# Patient Record
Sex: Female | Born: 1970 | Race: White | Hispanic: No | Smoking: Never smoker
Health system: Southern US, Community
[De-identification: ages and names within clinical notes are randomized; demographics above are authoritative.]

## PROBLEM LIST (undated history)

## (undated) DIAGNOSIS — Z800 Family history of malignant neoplasm of digestive organs: Secondary | ICD-10-CM

## (undated) DIAGNOSIS — N390 Urinary tract infection, site not specified: Secondary | ICD-10-CM

## (undated) DIAGNOSIS — Z8619 Personal history of other infectious and parasitic diseases: Secondary | ICD-10-CM

## (undated) DIAGNOSIS — Z8052 Family history of malignant neoplasm of bladder: Secondary | ICD-10-CM

## (undated) DIAGNOSIS — T7840XA Allergy, unspecified, initial encounter: Secondary | ICD-10-CM

## (undated) HISTORY — PX: BREAST CYST ASPIRATION: SHX578

## (undated) HISTORY — DX: Personal history of other infectious and parasitic diseases: Z86.19

## (undated) HISTORY — DX: Urinary tract infection, site not specified: N39.0

## (undated) HISTORY — DX: Family history of malignant neoplasm of bladder: Z80.52

## (undated) HISTORY — DX: Family history of malignant neoplasm of digestive organs: Z80.0

## (undated) HISTORY — DX: Allergy, unspecified, initial encounter: T78.40XA

---

## 1998-03-02 ENCOUNTER — Other Ambulatory Visit: Admission: RE | Admit: 1998-03-02 | Discharge: 1998-03-02 | Payer: Self-pay | Admitting: Obstetrics and Gynecology

## 1998-10-22 ENCOUNTER — Ambulatory Visit (HOSPITAL_COMMUNITY): Admission: RE | Admit: 1998-10-22 | Discharge: 1998-10-22 | Payer: Self-pay | Admitting: Obstetrics and Gynecology

## 1999-06-24 ENCOUNTER — Other Ambulatory Visit: Admission: RE | Admit: 1999-06-24 | Discharge: 1999-06-24 | Payer: Self-pay | Admitting: Obstetrics and Gynecology

## 2000-02-08 HISTORY — PX: DILATION AND CURETTAGE OF UTERUS: SHX78

## 2000-07-17 ENCOUNTER — Other Ambulatory Visit: Admission: RE | Admit: 2000-07-17 | Discharge: 2000-07-17 | Payer: Self-pay | Admitting: Obstetrics and Gynecology

## 2001-02-15 ENCOUNTER — Ambulatory Visit (HOSPITAL_COMMUNITY): Admission: RE | Admit: 2001-02-15 | Discharge: 2001-02-15 | Payer: Self-pay | Admitting: Obstetrics and Gynecology

## 2001-02-15 ENCOUNTER — Encounter (INDEPENDENT_AMBULATORY_CARE_PROVIDER_SITE_OTHER): Payer: Self-pay

## 2001-07-04 ENCOUNTER — Encounter: Payer: Self-pay | Admitting: Obstetrics and Gynecology

## 2001-07-04 ENCOUNTER — Inpatient Hospital Stay (HOSPITAL_COMMUNITY): Admission: AD | Admit: 2001-07-04 | Discharge: 2001-07-04 | Payer: Self-pay | Admitting: Obstetrics and Gynecology

## 2001-07-18 ENCOUNTER — Ambulatory Visit (HOSPITAL_COMMUNITY): Admission: RE | Admit: 2001-07-18 | Discharge: 2001-07-18 | Payer: Self-pay | Admitting: Obstetrics and Gynecology

## 2001-08-13 ENCOUNTER — Other Ambulatory Visit: Admission: RE | Admit: 2001-08-13 | Discharge: 2001-08-13 | Payer: Self-pay | Admitting: Obstetrics & Gynecology

## 2001-08-14 ENCOUNTER — Ambulatory Visit (HOSPITAL_COMMUNITY): Admission: RE | Admit: 2001-08-14 | Discharge: 2001-08-14 | Payer: Self-pay | Admitting: Obstetrics & Gynecology

## 2001-08-14 ENCOUNTER — Encounter (INDEPENDENT_AMBULATORY_CARE_PROVIDER_SITE_OTHER): Payer: Self-pay

## 2001-12-20 ENCOUNTER — Encounter (INDEPENDENT_AMBULATORY_CARE_PROVIDER_SITE_OTHER): Payer: Self-pay

## 2001-12-20 ENCOUNTER — Observation Stay (HOSPITAL_COMMUNITY): Admission: RE | Admit: 2001-12-20 | Discharge: 2001-12-21 | Payer: Self-pay | Admitting: Obstetrics & Gynecology

## 2001-12-20 ENCOUNTER — Encounter: Payer: Self-pay | Admitting: Obstetrics & Gynecology

## 2002-10-16 ENCOUNTER — Other Ambulatory Visit: Admission: RE | Admit: 2002-10-16 | Discharge: 2002-10-16 | Payer: Self-pay | Admitting: Obstetrics & Gynecology

## 2003-10-27 ENCOUNTER — Other Ambulatory Visit: Admission: RE | Admit: 2003-10-27 | Discharge: 2003-10-27 | Payer: Self-pay | Admitting: Obstetrics & Gynecology

## 2005-01-26 ENCOUNTER — Other Ambulatory Visit: Admission: RE | Admit: 2005-01-26 | Discharge: 2005-01-26 | Payer: Self-pay | Admitting: Obstetrics & Gynecology

## 2005-02-25 ENCOUNTER — Ambulatory Visit: Payer: Self-pay | Admitting: Internal Medicine

## 2005-06-22 ENCOUNTER — Ambulatory Visit: Payer: Self-pay | Admitting: Internal Medicine

## 2006-01-17 ENCOUNTER — Ambulatory Visit: Payer: Self-pay | Admitting: Internal Medicine

## 2006-05-25 ENCOUNTER — Ambulatory Visit: Payer: Self-pay | Admitting: Internal Medicine

## 2006-11-01 DIAGNOSIS — B019 Varicella without complication: Secondary | ICD-10-CM | POA: Insufficient documentation

## 2008-07-22 ENCOUNTER — Ambulatory Visit: Payer: Self-pay | Admitting: Internal Medicine

## 2008-07-22 DIAGNOSIS — K921 Melena: Secondary | ICD-10-CM | POA: Insufficient documentation

## 2008-07-22 DIAGNOSIS — M542 Cervicalgia: Secondary | ICD-10-CM | POA: Insufficient documentation

## 2008-08-08 ENCOUNTER — Encounter: Payer: Self-pay | Admitting: Internal Medicine

## 2008-08-08 HISTORY — PX: COLONOSCOPY: SHX174

## 2008-08-18 ENCOUNTER — Telehealth: Payer: Self-pay | Admitting: Internal Medicine

## 2008-08-22 ENCOUNTER — Ambulatory Visit: Payer: Self-pay | Admitting: Internal Medicine

## 2008-08-22 LAB — CONVERTED CEMR LAB
ALT: 16 units/L (ref 0–35)
AST: 20 units/L (ref 0–37)
Albumin: 4.4 g/dL (ref 3.5–5.2)
Alkaline Phosphatase: 38 units/L — ABNORMAL LOW (ref 39–117)
BUN: 15 mg/dL (ref 6–23)
Basophils Absolute: 0 10*3/uL (ref 0.0–0.1)
Basophils Relative: 0.7 % (ref 0.0–3.0)
Bilirubin Urine: NEGATIVE
Bilirubin, Direct: 0.2 mg/dL (ref 0.0–0.3)
CO2: 28 meq/L (ref 19–32)
Calcium: 9.4 mg/dL (ref 8.4–10.5)
Chloride: 110 meq/L (ref 96–112)
Cholesterol: 152 mg/dL (ref 0–200)
Creatinine, Ser: 0.9 mg/dL (ref 0.4–1.2)
Eosinophils Absolute: 0.2 10*3/uL (ref 0.0–0.7)
Eosinophils Relative: 3.7 % (ref 0.0–5.0)
GFR calc non Af Amer: 73.67 mL/min (ref >60)
Glucose, Bld: 79 mg/dL (ref 70–99)
HCT: 40 % (ref 36.0–46.0)
HDL: 47.5 mg/dL (ref >39.00)
Hemoglobin, Urine: NEGATIVE
Hemoglobin: 13.4 g/dL (ref 12.0–15.0)
Ketones, ur: NEGATIVE mg/dL
LDL Cholesterol: 91 mg/dL (ref 0–99)
Leukocytes, UA: NEGATIVE
Lymphocytes Relative: 26.4 % (ref 12.0–46.0)
Lymphs Abs: 1.2 10*3/uL (ref 0.7–4.0)
MCHC: 33.6 g/dL (ref 30.0–36.0)
MCV: 86.8 fL (ref 78.0–100.0)
Monocytes Absolute: 0.6 10*3/uL (ref 0.1–1.0)
Monocytes Relative: 12.5 % — ABNORMAL HIGH (ref 3.0–12.0)
Neutro Abs: 2.7 10*3/uL (ref 1.4–7.7)
Neutrophils Relative %: 56.7 % (ref 43.0–77.0)
Nitrite: NEGATIVE
Platelets: 203 10*3/uL (ref 150.0–400.0)
Potassium: 4.2 meq/L (ref 3.5–5.1)
RBC: 4.6 M/uL (ref 3.87–5.11)
RDW: 13 % (ref 11.5–14.6)
Sodium: 143 meq/L (ref 135–145)
Specific Gravity, Urine: >=1.03 (ref 1.000–1.030)
TSH: 4.05 microintl units/mL (ref 0.35–5.50)
Total Bilirubin: 1.2 mg/dL (ref 0.3–1.2)
Total CHOL/HDL Ratio: 3
Total Protein: 7.1 g/dL (ref 6.0–8.3)
Triglycerides: 66 mg/dL (ref 0.0–149.0)
Urine Glucose: NEGATIVE mg/dL
Urobilinogen, UA: 0.2 (ref 0.0–1.0)
VLDL: 13.2 mg/dL (ref 0.0–40.0)
WBC: 4.7 10*3/uL (ref 4.5–10.5)
pH: 5 (ref 5.0–8.0)

## 2008-08-27 ENCOUNTER — Ambulatory Visit: Payer: Self-pay | Admitting: Internal Medicine

## 2009-02-05 ENCOUNTER — Ambulatory Visit: Payer: Self-pay | Admitting: Internal Medicine

## 2009-02-05 DIAGNOSIS — M25559 Pain in unspecified hip: Secondary | ICD-10-CM | POA: Insufficient documentation

## 2009-08-06 ENCOUNTER — Encounter: Payer: Self-pay | Admitting: Internal Medicine

## 2009-08-18 ENCOUNTER — Encounter: Payer: Self-pay | Admitting: Internal Medicine

## 2009-11-12 ENCOUNTER — Encounter: Payer: Self-pay | Admitting: Internal Medicine

## 2010-03-09 NOTE — Letter (Signed)
Summary: Dermatology / [facility]  Dermatology / [facility]   Imported By: Lennie Odor 09/15/2009 10:56:53  _____________________________________________________________________  External Attachment:    Type:   Image     Comment:   External Document

## 2010-03-09 NOTE — Letter (Signed)
Summary: [facility]   Imported By: Sherian Rein 11/26/2009 11:20:14  _____________________________________________________________________  External Attachment:    Type:   Image     Comment:   External Document

## 2010-06-25 NOTE — Op Note (Signed)
[facility]  Patient:    Tracey Leblanc, Tracey Leblanc Visit Number: [id number] MRN: [id number]          Service Type: DSU Location: [facility] Attending Physician:  Shaune Spittle Dictated by:   Maris Berger. Pennie Rushing, M.D. Proc. Date: 02/15/01 Admit Date:  02/15/2001                             Operative Report  PREOPERATIVE DIAGNOSIS:       Missed abortion at eight weeks.  POSTOPERATIVE DIAGNOSIS:      Missed abortion at eight weeks.  OPERATION:                    Suction dilatation and evacuation.  SURGEON:                      Vanessa P. Pennie Rushing, M.D.  ANESTHESIA:                   Monitored anesthesia care and local.  ESTIMATED BLOOD LOSS:         Less than 50 cc.  COMPLICATIONS:                None.  FINDINGS:                     The uterus was approximately 8-10 week size with a moderate amount of products of conception.  DESCRIPTION OF PROCEDURE:     The patient was taken to the operating room after appropriate identification and placed on the operating table.  After placement of equipment for monitored anesthesia care, she was placed in the lithotomy position.  The perineum and vagina were prepped with multiple layers of Betadine and draped as a sterile field.  A red rubber Robinson catheter was used to empty the bladder.  A Graves speculum was placed in the vagina and a single-tooth tenaculum placed on the anterior cervix.  A paracervical block was achieved with a total of 10 cc of 2% Xylocaine at the 5 and 7 oclock positions.  The cervix was then dilated to accommodate a #8 suction curet. This was used to suction evacuate all quadrants of the uterus.  The uterine cavity was then curetted to ensure that all products of conception had been removed.  All instruments were then removed from the vagina and the patient was taken from the operating room to the recovery room in satisfactory condition, having tolerated the procedure well, with  sponge and instrument counts correct.  SPECIMENS TO PATHOLOGY:       Products of conception.  BLOOD TYPE:                   O negative.  DISCHARGE INSTRUCTIONS:       The patient is to receive RhoGAM prior to discharge.  DISCHARGE MEDICATIONS:        Methergine 0.2 mg p.o. q.6h. for eight doses. Ibuprofen over-the-counter 600 mg p.o. q.6h. p.r.n. pain.  Doxycycline 100 gm p.o. b.i.d. for five days.  FOLLOW UP:                    The patient is to follow up in two weeks.  INSTRUCTIONS:                 Printed instructions from the [facility] for D&E. Dictated by:   Maris Berger. Haygood,  M.D. Attending Physician:  Shaune Spittle DD:  02/15/01 TD:  02/15/01 Job: [id number]

## 2010-06-25 NOTE — Op Note (Signed)
   NAME:  Tracey Leblanc, Tracey Leblanc                      ACCOUNT NO.:  [id number]   MEDICAL RECORD NO.:  [id number]                   PATIENT TYPE:  AMB   LOCATION:  SDC                                  FACILITY:  [facility]   PHYSICIAN:  Freddy Finner, M.D.                DATE OF BIRTH:  Apr 11, 1970   DATE OF PROCEDURE:  12/20/2001  DATE OF DISCHARGE:                                 OPERATIVE REPORT   ADDENDUM:   PREOPERATIVE DIAGNOSIS:  Missed abortion with ultrasound evidence of failed  evacuation of the uterine cavity, suspected cornual pregnancy versus uterine  perforation with failure to evacuate the cavity.   POSTOPERATIVE DIAGNOSES:  1. Incomplete perforation of uterine wall.  2. Acutely anteflexed fundus with retained missed abortion products of     conception.   OPERATIVE PROCEDURE:  Dilatation and evacuation under ultrasound guidance  with evacuation of uterine cavity and identification by ultrasound of  incomplete perforation of uterus posterior to gestational sac.   SURGEON:  Freddy Finner, M.D.   ANESTHESIA:  General.   INTRAOPERATIVE COMPLICATIONS:  None.   ESTIMATED INTRAOPERATIVE BLOOD LOSS:  Less than or equal to 50 cc.   DESCRIPTION OF PROCEDURE:  The previous operative note described the  procedure to date.  On return to the operating room, blunt-tipped polyp  forceps were placed into the tract generated by the previous dilatation.  This appeared to pass to the right and inferior to the gestational sac.  Using a Pratt dilator and ultrasound guidance, the internal os of the cervix  was navigated under ultrasound.  This allowed placement of an 8 mm curved  suction cannula and ultrasound real time observation of evacuation of  cavity.  The Pratt dilator #25 was again passed along the original tract.  It could be traced to the uterine wall posteriorly, but no through and  through perforation identifiable by ultrasound on the serosal surface of the  uterus.  There was  small accumulation of presumed blood or small hematoma  posterior to the uterus.  Based on the complications of this procedure, it  was elected to admit the patient for overnight observation.  The procedure  at this point was terminated.  She was taken to the recovery room and will  be admitted as noted above.                                                Freddy Finner, M.D.    WRN/MEDQ  D:  12/20/2001  T:  12/20/2001  Job:  [id number]

## 2010-06-25 NOTE — Op Note (Signed)
NAME:  Tracey Leblanc, Tracey Leblanc                      ACCOUNT NO.:  [id number]   MEDICAL RECORD NO.:  [id number]                   PATIENT TYPE:  AMB   LOCATION:  SDC                                  FACILITY:  [facility]   PHYSICIAN:  Freddy Finner, M.D.                DATE OF BIRTH:  1970/11/05   DATE OF PROCEDURE:  12/20/2001  DATE OF DISCHARGE:                                 OPERATIVE REPORT   PREOPERATIVE DIAGNOSIS:  Missed abortion.   POSTOPERATIVE DIAGNOSIS:  Missed abortion with concern over failure to  adequate remove the pregnancy.   ANESTHESIA:  Managed intravenous sedation and paracervical block.   INTRAOPERATIVE COMPLICATIONS:  None immediately apparent at the conclusion  of the procedure.   INDICATIONS:  The patient is a 40 year old with her third successive  nonviable pregnancy.  She was found to have a nonviable pregnancy by pelvic  ultrasound in the office on the day prior to surgery with a 7-week size  fetus, which was nonviable with no fetal heart activity.   DESCRIPTION OF PROCEDURE:  The patient was admitted, brought to the  operating room, and there placed under intravenous sedation.  A paracervical  block was placed using 10 cc of 1% plain Xylocaine.  The cervix was grasped  with a single-tooth tenaculum and progressively dilated to 23 and later to  25 with [facility] dilators.  The cavity was felt to be entered and was  thoroughly sampled with a Haney curet in anticipation of obtaining material  for chromosomal analysis.  The significant finding was the absence of  definitive parts products of conception.  A careful bimanual examination was  performed and the uterus was asymmetrically enlarged to the right with a 3-4  cm, soft, nodular area on the fundus.  The curet was placed into the uterine  cavity and could be palpated through the anterior abdominal wall and was  felt to still be within the endometrial cavity.  At this point, the  procedure was terminated and the  patient was taken to recovery with a plan  for bedside ultrasound, which was accomplished and showing that the  pregnancy had not been removed.  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  There was a suggestion of a 3-4 cm hematoma in the pelvis in  the cul-de-sac.  All of these findings raised significant concern.  The plan  is at this point to take the patient back to the operating room for  ultrasound intraoperatively and if adequate access to the procedure cannot  be accomplished in this way, laparoscopy and possible laparotomy.  The  differential diagnosis certainly includes the possibility of a cornual or  saccular pregnancy.  The estimated intraoperative blood loss during this  part of the procedure was less than 25 cc.  This concludes the dictation of  this portion of the operative note.  An addendum will be made after  completion of the additional procedure.                                               Freddy Finner, M.D.    WRN/MEDQ  D:  12/20/2001  T:  12/20/2001  Job:  [id number]

## 2010-06-25 NOTE — Op Note (Signed)
[facility]  Patient:    Tracey Leblanc, Tracey Leblanc Visit Number: [id number] MRN: [id number]          Service Type: DSU Location: [facility] Attending Physician:  Minette Headland Dictated by:   Freddy Finner, M.D. Proc. Date: 08/14/01 Admit Date:  08/14/2001                             Operative Report  PREOPERATIVE DIAGNOSIS:  Missed abortion.  POSTOPERATIVE DIAGNOSIS:  Missed abortion.  OPERATION:  Dilatation and evacuation.  SURGEON:  Freddy Finner, M.D.  ANESTHESIA: Intravenous sedation and paracervical block.  INTRAOPERATIVE COMPLICATIONS:  None.  ESTIMATED BLOOD LOSS:  20 cc.  BRIEF HISTORY:  This is a 40 year old gravida 3, para 0.  She presented in early pregnancy at approximately 6-7 weeks with spotting, and had an ultrasound showing a viable pregnancy.  She presented again on the 7th the day prior to surgery for her physical examination as a new OB patient and was found to have no fetal heart tone on auscultation of the fetal heart with Doppler.  Subsequent ultrasound showed a nonviable 8 week size fetus in a degenerating sac with no fetal heart activity.  She is now admitted for D&E.  She was brought to the operating room post ______ with intravenous sedation and placed in the dorsolithotomy position.  Betadine prep was carried out in the usual fashion.  Cervix was visualized with a bivalve speculum.  A paracervical block was placed using tunnel.  A 10 cc. of 1% Xylocaine with injections at 4 and 8 oclock in the vaginal fornices.  Anterior cervix was grasped with a single toothed tenaculum.  Cervix was progressively dilated to 27 with Pratts.  A 9 mm curved suction cannula was introduced and aspiration produced obvious parts of conception.  This was continued until the pelvic cavity was evacuated.  This was confirmed with gentle sharp curettage and expiration with Randall stone forceps and repeat vacuum aspiration.  The procedure  was terminated.  The instruments removed.  The patient was taken to recovery room in good condition.  The status of her RhoGAM situation will be evaluated.  She did receive RhoGAM at approximately [redacted] weeks gestation and if necessary additional RhoGAM is to be given in the recovery room.  She was given routine outpatient surgical instructions for follow up in the office in approximately 1 week. Dictated by:   Freddy Finner, M.D. Attending Physician:  Minette Headland DD:  08/14/01 TD:  08/14/01 Job: [id number]

## 2010-06-25 NOTE — H&P (Signed)
[facility]  Patient:    Tracey Leblanc, Tracey Leblanc Visit Number: [id number] MRN: [id number]          Service Type: DSU Location: [facility] Attending Physician:  Minette Headland Dictated by:   Freddy Finner, M.D. Admit Date:  08/14/2001                           History and Physical  NO DICTATION Dictated by:   Freddy Finner, M.D. Attending Physician:  Minette Headland DD:  08/14/01 TD:  08/14/01 Job: [id number]

## 2013-06-26 DIAGNOSIS — Z85828 Personal history of other malignant neoplasm of skin: Secondary | ICD-10-CM | POA: Insufficient documentation

## 2016-02-12 DIAGNOSIS — D485 Neoplasm of uncertain behavior of skin: Secondary | ICD-10-CM | POA: Diagnosis not present

## 2016-02-12 DIAGNOSIS — D2261 Melanocytic nevi of right upper limb, including shoulder: Secondary | ICD-10-CM | POA: Diagnosis not present

## 2016-02-12 DIAGNOSIS — D2271 Melanocytic nevi of right lower limb, including hip: Secondary | ICD-10-CM | POA: Diagnosis not present

## 2016-02-12 DIAGNOSIS — D230 Other benign neoplasm of skin of lip: Secondary | ICD-10-CM | POA: Diagnosis not present

## 2016-02-12 DIAGNOSIS — D1801 Hemangioma of skin and subcutaneous tissue: Secondary | ICD-10-CM | POA: Diagnosis not present

## 2016-02-12 DIAGNOSIS — D2262 Melanocytic nevi of left upper limb, including shoulder: Secondary | ICD-10-CM | POA: Diagnosis not present

## 2016-02-12 DIAGNOSIS — L821 Other seborrheic keratosis: Secondary | ICD-10-CM | POA: Diagnosis not present

## 2016-02-12 DIAGNOSIS — L853 Xerosis cutis: Secondary | ICD-10-CM | POA: Diagnosis not present

## 2016-02-12 DIAGNOSIS — D2272 Melanocytic nevi of left lower limb, including hip: Secondary | ICD-10-CM | POA: Diagnosis not present

## 2016-03-30 DIAGNOSIS — J029 Acute pharyngitis, unspecified: Secondary | ICD-10-CM | POA: Diagnosis not present

## 2016-03-30 DIAGNOSIS — J069 Acute upper respiratory infection, unspecified: Secondary | ICD-10-CM | POA: Diagnosis not present

## 2016-07-13 DIAGNOSIS — F411 Generalized anxiety disorder: Secondary | ICD-10-CM | POA: Diagnosis not present

## 2016-07-13 DIAGNOSIS — Z01419 Encounter for gynecological examination (general) (routine) without abnormal findings: Secondary | ICD-10-CM | POA: Diagnosis not present

## 2016-07-13 DIAGNOSIS — Z1231 Encounter for screening mammogram for malignant neoplasm of breast: Secondary | ICD-10-CM | POA: Diagnosis not present

## 2016-07-13 DIAGNOSIS — Z681 Body mass index (BMI) 19 or less, adult: Secondary | ICD-10-CM | POA: Diagnosis not present

## 2016-07-20 ENCOUNTER — Other Ambulatory Visit: Payer: Self-pay | Admitting: Obstetrics and Gynecology

## 2016-07-20 DIAGNOSIS — R928 Other abnormal and inconclusive findings on diagnostic imaging of breast: Secondary | ICD-10-CM

## 2016-07-22 ENCOUNTER — Ambulatory Visit
Admission: RE | Admit: 2016-07-22 | Discharge: 2016-07-22 | Disposition: A | Discharge status: Home / Self Care | Payer: 59 | Source: Ambulatory Visit | Attending: Obstetrics and Gynecology | Admitting: Obstetrics and Gynecology

## 2016-07-22 DIAGNOSIS — R922 Inconclusive mammogram: Secondary | ICD-10-CM | POA: Diagnosis not present

## 2016-07-22 DIAGNOSIS — N6324 Unspecified lump in the left breast, lower inner quadrant: Secondary | ICD-10-CM | POA: Diagnosis not present

## 2016-07-22 DIAGNOSIS — R928 Other abnormal and inconclusive findings on diagnostic imaging of breast: Secondary | ICD-10-CM

## 2016-09-29 ENCOUNTER — Telehealth: Payer: Self-pay | Admitting: Genetic Counselor

## 2016-09-29 ENCOUNTER — Other Ambulatory Visit: Payer: 59

## 2016-09-29 ENCOUNTER — Encounter: Payer: 59 | Admitting: Genetics

## 2016-09-29 NOTE — Telephone Encounter (Signed)
Appt has been rescheduled for the pt to see Santiago Glad on 9/10 at 3pm. Due to the pt's work schedule, she was only able to come in at 3pm.

## 2016-10-17 ENCOUNTER — Other Ambulatory Visit: Payer: 59

## 2016-10-17 ENCOUNTER — Encounter: Payer: Self-pay | Admitting: Genetics

## 2016-10-17 ENCOUNTER — Ambulatory Visit (HOSPITAL_BASED_OUTPATIENT_CLINIC_OR_DEPARTMENT_OTHER): Payer: 59 | Admitting: Genetics

## 2016-10-17 DIAGNOSIS — Z800 Family history of malignant neoplasm of digestive organs: Secondary | ICD-10-CM

## 2016-10-17 DIAGNOSIS — Z8052 Family history of malignant neoplasm of bladder: Secondary | ICD-10-CM

## 2016-10-17 DIAGNOSIS — Z7183 Encounter for nonprocreative genetic counseling: Secondary | ICD-10-CM | POA: Diagnosis not present

## 2016-10-17 DIAGNOSIS — Z809 Family history of malignant neoplasm, unspecified: Secondary | ICD-10-CM

## 2016-10-17 NOTE — Progress Notes (Signed)
REFERRING PROVIDER: Juanda Chance, NP [address] [place] [address] [place], [place] [address]  PRIMARY PROVIDER:  Plotnikov, Evie Lacks, MD  PRIMARY REASON FOR VISIT:  1. Family history of liver cancer   2. Family history of bladder cancer   3. Family history of cancer     HISTORY OF PRESENT ILLNESS:   Tracey Leblanc, a 46 y.o. female, was seen for a [facility] cancer genetics consultation at the request of Juanda Chance, NP due to a family history of cancer.  Tracey Leblanc presents to clinic today to discuss the possibility of a hereditary predisposition to cancer, genetic testing, and to further clarify her future cancer risks, as well as potential cancer risks for family members.   CANCER HISTORY:  Tracey Leblanc is a 46 y.o. female who reports no personal history of cancer, though records note a basal cell carcinoma of her upper arm in November 2014.  No past medical history on file.  Past Surgical History:  Procedure Laterality Date  . BREAST CYST ASPIRATION      Social History   Social History  . Marital status: Married    Spouse name: N/A  . Number of children: N/A  . Years of education: N/A   Social History Main Topics  . Smoking status: Not on file  . Smokeless tobacco: Not on file  . Alcohol use Not on file  . Drug use: Unknown  . Sexual activity: Not on file   Other Topics Concern  . Not on file   Social History Narrative  . No narrative on file     FAMILY HISTORY:  We obtained a detailed, 4-generation family history.  Significant diagnoses are listed below: Family History  Problem Relation Age of Onset  . Liver cancer Mother 73       d.61  . Bladder Cancer Father 6       diagnosed with second bladder cancer at 64  . Cancer Maternal Uncle        d.70s unspecified type of cancer  . Cancer Maternal Grandfather        d.84 unspecified type of cancer  . Lung cancer Paternal Grandfather   . Testicular cancer Paternal Grandfather        d.85    Tracey Leblanc has one adopted son (no biological relationship). She also has a brother, age 74, without cancers.  Tracey Leblanc mother was diagnosed with liver cancer at age 41 and died at 36. As far as Tracey Leblanc knows, the liver was the primary site of her mother's cancer, though she wonders if there was another primary that was never identified. She states that her mother's cancer was advanced at the time of diagnosis. Tracey Leblanc has three maternal aunts and two maternal uncles. One maternal aunt is 37 without cancer. The other two aunts lived to their 64s and 3s without cancer. One maternal uncle died in his 40s and had a history of an unspecified form of cancer. The other died in his 104s and had liver cirrhosis and a history of heavy drinking. Tracey Leblanc maternal grandfather died at 62 and had a history of an unspecified form of cancer. Tracey Leblanc maternal grandmother died at 22 from heart disease.  Tracey Leblanc father is currently 49 and was diagnosed with bladder cancer at age 41 and again at age 23. He also has a history of mycosis fungoides. He smoked until the time of his second bladder cancer diagnosis. Tracey Leblanc has one paternal uncle who  is 77 without cancers. Tracey Leblanc paternal grandfather died at age 64 and had a history of lung and testicular cancer. Her paternal grandmother died in her early-80s from heart disease.  Tracey Leblanc is unaware of previous family history of genetic testing for hereditary cancer risks. Patient's maternal and paternal ancestors are of Caucasian descent. There is no reported Ashkenazi Jewish ancestry. There is no known consanguinity.  GENETIC COUNSELING ASSESSMENT: Tracey Leblanc is a 46 y.o. female with a personal history of basal carcinoma and a family history of liver, bladder, lung, testicular, and other unspecified forms of cancer. We discussed with [name] that her family history is not  highly consistent with a hereditary cancer syndrome, and we feel she is at low risk to harbor a gene mutation associated with such a condition. Though there is no genetic testing for a specific hereditary cancer syndrome that is recommended for Tracey Leblanc, we discussed the option to test for hereditary cancer syndromes through self-pay options. We emphasized that the chance [name] has a hereditary cancer syndrome is low and even if she carries a pathogenic mutation in a gene, it is unlikely to explain her family history of cancers. [name] expressed understanding of this and maintained an interested in testing for hereditary cancer syndromes. We, therefore, discussed and recommended the following at today's visit.   DISCUSSION: We reviewed the characteristics, features and inheritance patterns of hereditary cancer syndromes. We also discussed genetic testing, including the appropriate family members to test, the process of testing, insurance coverage and turn-around-time for results. We discussed the implications of a negative, positive and/or variant of uncertain significant result. We reviewed [name]'s option to pursue genetic testing through [facility]'s 83-gene Multi-Cancer Panel vs. [facility]'s 46-gene Common Hereditary Cancer Panel. We reviewed the benefits and limitations of each panel in detail. Ultimately, [name] elected to pursue testing through [facility]'s Multi-Cancer Panel. [facility]'s Multi-Cancer Panel includes analysis of the following 83 genes: ALK, APC, ATM, AXIN2, BAP1, BARD1, BLM, BMPR1A, BRCA1, BRCA2, BRIP1, CASR, CDC73, CDH1, CDK4, CDKN1B, CDKN1C, CDKN2A, CEBPA, CHEK2, CTNNA1, DICER1, DIS3L2, EGFR, EPCAM, FH, FLCN, GATA2, GPC3, GREM1, HOXB13, HRAS, KIT, MAX, MEN1, MET, MITF, MLH1, MSH2, MSH3, MSH6, MUTYH, NBN, NF1, NF2, NTHL1, PALB2, PDGFRA, PHOX2B, PMS2, POLD1, POLE, POT1, PRKAR1A, PTCH1, PTEN, RAD50, RAD51C, RAD51D, RB1, RECQL4, RET, RUNX1, SDHA, SDHAF2,  SDHB, SDHC, SDHD, SMAD4, SMARCA4, SMARCB1, SMARCE1, STK11, SUFU, TERC, TERT, TMEM127, TP53, TSC1, TSC2, VHL, WRN, WT1.   Based on [name]'s personal and family history of cancer, she does not appear to meet medical criteria for genetic testing. Therefore, she elected to pursue genetic testing through [facility]'s $250 self-pay option. [facility] will contact Ms. Birkel directly to discuss payment and billing details.   PLAN: After considering the risks, benefits, and limitations, Tracey Leblanc  provided informed consent to pursue genetic testing and the blood sample was sent to [facility] for analysis of the 83-gene Multi-Cancer Panel. Results should be available within approximately 3 weeks' time, at which point they will be disclosed by telephone to Ms. Cavey, as will any additional recommendations warranted by these results. This information will also be available in Epic. Per Ms. Scribner's request, results will be routed to Dr. Julien Girt at [facility].  Lastly, we encouraged Ms. Blankenburg to remain in contact with cancer genetics annually so that we can continuously update the family history and inform her of any changes in cancer genetics and testing that may be of benefit for this family.   Ms.  Valin's questions were answered to her satisfaction today. Our contact information was provided should additional questions or concerns arise.   Mal Misty, MS, [facility] Certified Naval [email or web address] phone: [phone]  The patient was seen for a total of 35 minutes in face-to-face genetic counseling.    _______________________________________________________________________ For Office Staff:  Number of people involved in session: 1 Was an Intern/ student involved with case: yes

## 2016-11-01 ENCOUNTER — Telehealth: Payer: Self-pay | Admitting: Genetic Counselor

## 2016-11-01 ENCOUNTER — Encounter: Payer: Self-pay | Admitting: Genetic Counselor

## 2016-11-01 ENCOUNTER — Ambulatory Visit: Payer: Self-pay | Admitting: Genetic Counselor

## 2016-11-01 DIAGNOSIS — Z8052 Family history of malignant neoplasm of bladder: Secondary | ICD-10-CM

## 2016-11-01 DIAGNOSIS — Z800 Family history of malignant neoplasm of digestive organs: Secondary | ICD-10-CM | POA: Insufficient documentation

## 2016-11-01 DIAGNOSIS — Z1379 Encounter for other screening for genetic and chromosomal anomalies: Secondary | ICD-10-CM | POA: Insufficient documentation

## 2016-11-01 NOTE — Progress Notes (Signed)
HPI: Ms. Penick was previously seen in the [facility] [facility] clinic due to a family history of cancer and concerns regarding a hereditary predisposition to cancer. Please refer to our prior cancer genetics clinic note for more information regarding Ms. Picado's medical, social and family histories, and our assessment and recommendations, at the time. Ms. Norell recent genetic test results were disclosed to her, as were recommendations warranted by these results. These results and recommendations are discussed in more detail below.  CANCER HISTORY:   No history exists.    FAMILY HISTORY:  We obtained a detailed, 4-generation family history.  Significant diagnoses are listed below: Family History  Problem Relation Age of Onset  . Liver cancer Mother 39       d.61  . Bladder Cancer Father 45       diagnosed with second bladder cancer at 33  . Cancer Maternal Uncle        d.70s unspecified type of cancer  . Cancer Maternal Grandfather        d.84 unspecified type of cancer  . Lung cancer Paternal Grandfather   . Testicular cancer Paternal Grandfather        d.85    Ms. Grossi has one adopted son (no biological relationship). She also has a brother, age 41, without cancers.  Ms. Fels mother was diagnosed with liver cancer at age 41 and died at 22. As far as Ms. Carton knows, the liver was the primary site of her mother's cancer, though she wonders if there was another primary that was never identified. She states that her mother's cancer was advanced at the time of diagnosis. Ms. Stodghill has three maternal aunts and two maternal uncles. One maternal aunt is 55 without cancer. The other two aunts lived to their 37's and 33's without cancer. One maternal uncle died in his 37's and had a history of an unspecified form of cancer. The other died in his 75's and had liver cirrhosis and a history of heavy drinking. Ms. Balducci maternal  grandfather died at 29 and had a history of an unspecified form of cancer. Ms. Wester maternal grandmother died at 42 from heart disease.  Ms. Stege father is currently 53 and was diagnosed with bladder cancer at age 41 and again at age 57. He also has a history of mycosis fungoides. He smoked until the time of his second bladder cancer diagnosis. Ms. Nguyen has one paternal uncle who is 73 without cancers. Ms. Attwood paternal grandfather died at age 61 and had a history of lung and testicular cancer. Her paternal grandmother died in her early-80's from heart disease.  Ms. Hush is unaware of previous family history of genetic testing for hereditary cancer risks. Patient's maternal and paternal ancestors are of Caucasian descent. There is no reported Ashkenazi Jewish ancestry. There is no known consanguinity.  GENETIC TEST RESULTS: Genetic testing reported out on October 28, 2016 through the [facility] cancer panel found no deleterious mutations.  The Multi-Gene Panel offered by [facility] includes sequencing and/or deletion duplication testing of the following 83 genes: ALK, APC, ATM, AXIN2,BAP1,  BARD1, BLM, BMPR1A, BRCA1, BRCA2, BRIP1, CASR, CDC73, CDH1, CDK4, CDKN1B, CDKN1C, CDKN2A (p14ARF), CDKN2A (p16INK4a), CEBPA, CHEK2, CTNNA1, DICER1, DIS3L2, EGFR (c.2369C>T, p.Thr790Met variant only), EPCAM (Deletion/duplication testing only), FH, FLCN, GATA2, GPC3, GREM1 (Promoter region deletion/duplication testing only), HOXB13 (c.251G>A, p.Gly84Glu), HRAS, KIT, MAX, MEN1, MET, MITF (c.952G>A, p.Glu318Lys variant only), MLH1, MSH2, MSH3, MSH6, MUTYH, NBN, NF1, NF2, NTHL1, PALB2, PDGFRA, PHOX2B, PMS2, POLD1, POLE,  POT1, PRKAR1A, PTCH1, PTEN, RAD50, RAD51C, RAD51D, RB1, RECQL4, RET, RUNX1, SDHAF2, SDHA (sequence changes only), SDHB, SDHC, SDHD, SMAD4, SMARCA4, SMARCB1, SMARCE1, STK11, SUFU, TERT, TERT, TMEM127, TP53, TSC1, TSC2, VHL, WRN and WT1.    The test report has been scanned  into EPIC and is located under the Molecular Pathology section of the Results Review tab.   We discussed with Ms. Chesterfield that since the current genetic testing is not perfect, it is possible there may be a gene mutation in one of these genes that current testing cannot detect, but that chance is small. We also discussed, that it is possible that another gene that has not yet been discovered, or that we have not yet tested, is responsible for the cancer diagnoses in the family, and it is, therefore, important to remain in touch with cancer genetics in the future so that we can continue to offer Ms. Engebretsen the most up to date genetic testing.     CANCER SCREENING RECOMMENDATIONS:  This normal result is reassuring and indicates that Ms. Provencio does not likely have an increased risk of cancer due to a mutation in one of these genes.  We, therefore, recommended  Ms. Bernardini continue to follow the cancer screening guidelines provided by her primary healthcare providers.   RECOMMENDATIONS FOR FAMILY MEMBERS: Women in this family might be at some increased risk of developing cancer, over the general population risk, simply due to the family history of cancer. We recommended women in this family have a yearly mammogram beginning at age 68, or 24 years younger than the earliest onset of cancer, an annual clinical breast exam, and perform monthly breast self-exams. Women in this family should also have a gynecological exam as recommended by their primary provider. All family members should have a colonoscopy by age 10.  FOLLOW-UP: Lastly, we discussed with Ms. Schriever that cancer genetics is a rapidly advancing field and it is possible that new genetic tests will be appropriate for her and/or her family members in the future. We encouraged her to remain in contact with cancer genetics on an annual basis so we can update her personal and family histories and let her know of advances in  cancer genetics that may benefit this family.   Our contact number was provided. Ms. Mazzoni questions were answered to her satisfaction, and she knows she is welcome to call us at anytime with additional questions or concerns.   Roma Kayser, MS, [facility] Certified Genetic Counselor [email or web address]

## 2016-11-01 NOTE — Telephone Encounter (Signed)
Revealed negative genetic testing.  Discussed that we do not know why there is cancer in the family. It could be due to a different gene that we are not testing, or maybe our current technology may not be able to pick something up.  It will be important for her to keep in contact with genetics to keep up with whether additional testing may be needed.  

## 2016-11-01 NOTE — Telephone Encounter (Signed)
LM on VM with good news on results.  Asked for her to CB.  Left CB instructions.

## 2016-11-02 ENCOUNTER — Other Ambulatory Visit: Payer: Self-pay | Admitting: Obstetrics and Gynecology

## 2016-11-02 DIAGNOSIS — N644 Mastodynia: Secondary | ICD-10-CM

## 2016-11-02 DIAGNOSIS — N6310 Unspecified lump in the right breast, unspecified quadrant: Secondary | ICD-10-CM | POA: Diagnosis not present

## 2016-11-04 ENCOUNTER — Other Ambulatory Visit: Payer: 59

## 2016-11-04 ENCOUNTER — Other Ambulatory Visit: Payer: Self-pay | Admitting: Obstetrics and Gynecology

## 2016-11-04 ENCOUNTER — Ambulatory Visit
Admission: RE | Admit: 2016-11-04 | Discharge: 2016-11-04 | Disposition: A | Discharge status: Home / Self Care | Payer: 59 | Source: Ambulatory Visit | Attending: Obstetrics and Gynecology | Admitting: Obstetrics and Gynecology

## 2016-11-04 DIAGNOSIS — N6001 Solitary cyst of right breast: Secondary | ICD-10-CM | POA: Diagnosis not present

## 2016-11-04 DIAGNOSIS — N644 Mastodynia: Secondary | ICD-10-CM

## 2017-03-10 DIAGNOSIS — D2272 Melanocytic nevi of left lower limb, including hip: Secondary | ICD-10-CM | POA: Diagnosis not present

## 2017-03-10 DIAGNOSIS — D2261 Melanocytic nevi of right upper limb, including shoulder: Secondary | ICD-10-CM | POA: Diagnosis not present

## 2017-03-10 DIAGNOSIS — D2262 Melanocytic nevi of left upper limb, including shoulder: Secondary | ICD-10-CM | POA: Diagnosis not present

## 2017-03-10 DIAGNOSIS — L821 Other seborrheic keratosis: Secondary | ICD-10-CM | POA: Diagnosis not present

## 2017-03-10 DIAGNOSIS — L720 Epidermal cyst: Secondary | ICD-10-CM | POA: Diagnosis not present

## 2017-03-10 DIAGNOSIS — D1801 Hemangioma of skin and subcutaneous tissue: Secondary | ICD-10-CM | POA: Diagnosis not present

## 2017-03-10 DIAGNOSIS — D2271 Melanocytic nevi of right lower limb, including hip: Secondary | ICD-10-CM | POA: Diagnosis not present

## 2017-03-10 DIAGNOSIS — D225 Melanocytic nevi of trunk: Secondary | ICD-10-CM | POA: Diagnosis not present

## 2017-05-18 ENCOUNTER — Emergency Department (HOSPITAL_BASED_OUTPATIENT_CLINIC_OR_DEPARTMENT_OTHER)
Admission: EM | Admit: 2017-05-18 | Discharge: 2017-05-18 | Disposition: A | Discharge status: Home / Self Care | Payer: 59 | Attending: Emergency Medicine | Admitting: Emergency Medicine

## 2017-05-18 ENCOUNTER — Encounter (HOSPITAL_BASED_OUTPATIENT_CLINIC_OR_DEPARTMENT_OTHER): Payer: Self-pay

## 2017-05-18 ENCOUNTER — Other Ambulatory Visit: Payer: Self-pay

## 2017-05-18 ENCOUNTER — Emergency Department (HOSPITAL_BASED_OUTPATIENT_CLINIC_OR_DEPARTMENT_OTHER): Payer: 59

## 2017-05-18 DIAGNOSIS — R110 Nausea: Secondary | ICD-10-CM | POA: Insufficient documentation

## 2017-05-18 DIAGNOSIS — R0789 Other chest pain: Secondary | ICD-10-CM | POA: Diagnosis not present

## 2017-05-18 DIAGNOSIS — R5383 Other fatigue: Secondary | ICD-10-CM | POA: Insufficient documentation

## 2017-05-18 DIAGNOSIS — R079 Chest pain, unspecified: Secondary | ICD-10-CM | POA: Diagnosis not present

## 2017-05-18 DIAGNOSIS — R0602 Shortness of breath: Secondary | ICD-10-CM | POA: Insufficient documentation

## 2017-05-18 LAB — BASIC METABOLIC PANEL
ANION GAP: 8 (ref 5–15)
BUN: 13 mg/dL (ref 6–20)
CALCIUM: 9.1 mg/dL (ref 8.9–10.3)
CO2: 25 mmol/L (ref 22–32)
CREATININE: 0.75 mg/dL (ref 0.44–1.00)
Chloride: 104 mmol/L (ref 101–111)
GFR calc non Af Amer: >60 mL/min (ref >60)
Glucose, Bld: 100 mg/dL — ABNORMAL HIGH (ref 65–99)
Potassium: 4.1 mmol/L (ref 3.5–5.1)
SODIUM: 137 mmol/L (ref 135–145)

## 2017-05-18 LAB — HEPATIC FUNCTION PANEL
ALT: 15 U/L (ref 14–54)
AST: 24 U/L (ref 15–41)
Albumin: 4.6 g/dL (ref 3.5–5.0)
Alkaline Phosphatase: 36 U/L — ABNORMAL LOW (ref 38–126)
Bilirubin, Direct: 0.1 mg/dL (ref 0.1–0.5)
Indirect Bilirubin: 0.3 mg/dL (ref 0.3–0.9)
Total Bilirubin: 0.4 mg/dL (ref 0.3–1.2)
Total Protein: 7.5 g/dL (ref 6.5–8.1)

## 2017-05-18 LAB — CBC
HCT: 41.1 % (ref 36.0–46.0)
HEMOGLOBIN: 13.4 g/dL (ref 12.0–15.0)
MCH: 28.3 pg (ref 26.0–34.0)
MCHC: 32.6 g/dL (ref 30.0–36.0)
MCV: 86.7 fL (ref 78.0–100.0)
Platelets: 295 10*3/uL (ref 150–400)
RBC: 4.74 MIL/uL (ref 3.87–5.11)
RDW: 13.6 % (ref 11.5–15.5)
WBC: 6.2 10*3/uL (ref 4.0–10.5)

## 2017-05-18 LAB — TROPONIN I: Troponin I: <0.03 ng/mL (ref <0.03)

## 2017-05-18 LAB — PREGNANCY, URINE: Preg Test, Ur: NEGATIVE

## 2017-05-18 MED ORDER — GI COCKTAIL ~~LOC~~
30.0000 mL | Freq: Once | ORAL | Status: AC
  Administered 2017-05-18: 30 mL via ORAL
  Filled 2017-05-18: qty 30

## 2017-05-18 NOTE — ED Triage Notes (Signed)
C/o central CP x 3-4 days-NAD-steady gait

## 2017-05-18 NOTE — ED Provider Notes (Signed)
[facility] EMERGENCY DEPARTMENT Provider Note  CSN: [id number] Arrival date & time: 05/18/17 1553  Chief Complaint(s) Chest Pain  HPI Tracey Leblanc is a 47 y.o. female   The history is provided by the patient.  Chest Pain   This is a new problem. Episode onset: 4 days. Episode frequency: intermittent. The problem has been resolved. The pain is present in the substernal region. The pain is mild. The quality of the pain is described as dull and pressure-like. The pain does not radiate. Exacerbated by: nothing. Associated symptoms include malaise/fatigue, nausea and shortness of breath (mild). Pertinent negatives include no back pain, no cough, no diaphoresis, no exertional chest pressure, no fever, no lower extremity edema and no palpitations. She has tried nothing for the symptoms.  Pertinent negatives for past medical history include no diabetes, no DVT, no hyperlipidemia, no hypertension, no MI, no PE and no strokes.  Her family medical history is significant for early MI.    Past Medical History Past Medical History:  Diagnosis Date  . Family history of bladder cancer   . Family history of liver cancer    Patient Active Problem List   Diagnosis Date Noted  . Genetic testing 11/01/2016  . Family history of liver cancer   . Family history of bladder cancer   . HIP PAIN 02/05/2009  . HEMATOCHEZIA 07/22/2008  . NECK PAIN 07/22/2008  . CHICKENPOX 11/01/2006   Home Medication(s) Prior to Admission medications   Not on File                                                                                                                                    Past Surgical History Past Surgical History:  Procedure Laterality Date  . BREAST CYST ASPIRATION     Family History Family History  Problem Relation Age of Onset  . Liver cancer Mother 13       d.61  . Bladder Cancer Father 17       diagnosed with second bladder cancer at 71  . Cancer Maternal Uncle    d.70s unspecified type of cancer  . Cancer Maternal Grandfather        d.84 unspecified type of cancer  . Lung cancer Paternal Grandfather   . Testicular cancer Paternal Grandfather        d.85    Social History Social History   Tobacco Use  . Smoking status: Never Smoker  . Smokeless tobacco: Never Used  Substance Use Topics  . Alcohol use: Never    Frequency: Never  . Drug use: Never   Allergies Patient has no known allergies.  Review of Systems Review of Systems  Constitutional: Positive for malaise/fatigue. Negative for diaphoresis and fever.  Respiratory: Positive for shortness of breath (mild). Negative for cough.   Cardiovascular: Positive for chest pain. Negative for palpitations.  Gastrointestinal: Positive for nausea.  Musculoskeletal: Negative for back pain.  All other systems are reviewed and are negative for acute change except as noted in the HPI  Physical Exam Vital Signs  I have reviewed the triage vital signs BP 125/83 (BP Location: Left Arm)   Pulse 79   Temp 98.7 F (37.1 C) (Oral)   Resp 16   Ht 5\' 8"  (1.727 m)   Wt 64.4 kg (142 lb)   LMP 05/04/2017   SpO2 100%   BMI 21.59 kg/m   Physical Exam  Constitutional: She is oriented to person, place, and time. She appears well-developed and well-nourished. No distress.  HENT:  Head: Normocephalic and atraumatic.  Nose: Nose normal.  Eyes: Pupils are equal, round, and reactive to light. Conjunctivae and EOM are normal. Right eye exhibits no discharge. Left eye exhibits no discharge. No scleral icterus.  Neck: Normal range of motion. Neck supple.  Cardiovascular: Normal rate and regular rhythm. Exam reveals no gallop and no friction rub.  No murmur heard. Pulmonary/Chest: Effort normal and breath sounds normal. No stridor. No respiratory distress. She has no rales.  Abdominal: Soft. She exhibits no distension. There is no tenderness.  Musculoskeletal: She exhibits no edema or tenderness.    Neurological: She is alert and oriented to person, place, and time.  Skin: Skin is warm and dry. No rash noted. She is not diaphoretic. No erythema.  Psychiatric: She has a normal mood and affect.  Vitals reviewed.   ED Results and Treatments Labs (all labs ordered are listed, but only abnormal results are displayed) Labs Reviewed  BASIC METABOLIC PANEL - Abnormal; Notable for the following components:      Result Value   Glucose, Bld 100 (*)    All other components within normal limits  HEPATIC FUNCTION PANEL - Abnormal; Notable for the following components:   Alkaline Phosphatase 36 (*)    All other components within normal limits  CBC  TROPONIN I  PREGNANCY, URINE  TROPONIN I                                                                                                                         EKG  EKG Interpretation  Date/Time:  Thursday May 18 2017 16:06:21 EDT Ventricular Rate:  83 PR Interval:  134 QRS Duration: 82 QT Interval:  380 QTC Calculation: 446 R Axis:   92 Text Interpretation:  Normal sinus rhythm Rightward axis Borderline ECG NO STEMI No old tracing to compare Confirmed by Addison Lank [phone]) on 05/18/2017 4:14:18 PM      Radiology Dg Chest 2 View  Result Date: 05/18/2017 CLINICAL DATA:  Intermittent chest pain and shortness of breath for the past few weeks. EXAM: CHEST - 2 VIEW COMPARISON:  None. FINDINGS: The heart size and mediastinal contours are within normal limits. Normal pulmonary vascularity. No focal consolidation, pleural effusion, or pneumothorax. No acute osseous abnormality. IMPRESSION: Normal chest x-ray. Electronically Signed   By: Titus Dubin M.D.   On: 05/18/2017 17:53   Pertinent labs &  imaging results that were available during my care of the patient were reviewed by me and considered in my medical decision making (see chart for details).  Medications Ordered in ED Medications  gi cocktail (Maalox,Lidocaine,Donnatal) (30 mLs  Oral Given 05/18/17 1925)                                                                                                                                    Procedures Procedures  (including critical care time)  Medical Decision Making / ED Course I have reviewed the nursing notes for this encounter and the patient's prior records (if available in EHR or on provided paperwork).    Atypical chest pain. EKG nonischemic. HEART <4. intial trop negative. approp for delta trop.  Low pretest for PE. PERC negative. Not classic for dissection or perforation.  Chest x-ray without evidence suggestive of pneumonia, pneumothorax, pneumomediastinum.  No abnormal contour of the mediastinum to suggest dissection. No evidence of acute injuries.  Pain recurred in the ED and resolved with GI cocktail.  Delta trop negative.   Likely GI in nature.   The patient appears reasonably screened and/or stabilized for discharge and I doubt any other medical condition or other [facility] requiring further screening, evaluation, or treatment in the ED at this time prior to discharge.  The patient is safe for discharge with strict return precautions.   Final Clinical Impression(s) / ED Diagnoses Final diagnoses:  Atypical chest pain    Disposition: Discharge  Condition: Good  I have discussed the results, Dx and Tx plan with the patient who expressed understanding and agree(s) with the plan. Discharge instructions discussed at great length. The patient was given strict return precautions who verbalized understanding of the instructions. No further questions at time of discharge.    ED Discharge Orders    None       Follow Up: Vivi Barrack, [place] [id number] [phone]  Schedule an appointment as soon as possible for a visit  As needed     This chart was dictated using voice recognition software.  Despite best efforts to proofread,  errors can occur which can change the  documentation meaning.   Fatima Blank, MD 05/18/17 2017

## 2017-06-23 ENCOUNTER — Ambulatory Visit (INDEPENDENT_AMBULATORY_CARE_PROVIDER_SITE_OTHER): Payer: 59 | Admitting: Family Medicine

## 2017-06-23 ENCOUNTER — Encounter: Payer: Self-pay | Admitting: Family Medicine

## 2017-06-23 VITALS — BP 98/80 | HR 73 | Temp 98.7°F | Resp 16 | Ht 69.25 in | Wt 141.1 lb

## 2017-06-23 DIAGNOSIS — R739 Hyperglycemia, unspecified: Secondary | ICD-10-CM | POA: Diagnosis not present

## 2017-06-23 DIAGNOSIS — Z114 Encounter for screening for human immunodeficiency virus [HIV]: Secondary | ICD-10-CM

## 2017-06-23 DIAGNOSIS — Z13220 Encounter for screening for lipoid disorders: Secondary | ICD-10-CM

## 2017-06-23 DIAGNOSIS — Z0001 Encounter for general adult medical examination with abnormal findings: Secondary | ICD-10-CM | POA: Diagnosis not present

## 2017-06-23 NOTE — Patient Instructions (Addendum)
It was very nice to meet you today!  You are doing a great job! Please keep up the good work.  Please come back soon for blood work.  I will see you again in 1 year, or as needed.  Take care, Dr Jerline Pain   Preventive Care 40-64 Years, Female Preventive care refers to lifestyle choices and visits with your health care provider that can promote health and wellness. What does preventive care include?  A yearly physical exam. This is also called an annual well check.  Dental exams once or twice a year.  Routine eye exams. Ask your health care provider how often you should have your eyes checked.  Personal lifestyle choices, including: ? Daily care of your teeth and gums. ? Regular physical activity. ? Eating a healthy diet. ? Avoiding tobacco and drug use. ? Limiting alcohol use. ? Practicing safe sex. ? Taking low-dose aspirin daily starting at age 71. ? Taking vitamin and mineral supplements as recommended by your health care provider. What happens during an annual well check? The services and screenings done by your health care provider during your annual well check will depend on your age, overall health, lifestyle risk factors, and family history of disease. Counseling Your health care provider may ask you questions about your:  Alcohol use.  Tobacco use.  Drug use.  Emotional well-being.  Home and relationship well-being.  Sexual activity.  Eating habits.  Work and work Statistician.  Method of birth control.  Menstrual cycle.  Pregnancy history.  Screening You may have the following tests or measurements:  Height, weight, and BMI.  Blood pressure.  Lipid and cholesterol levels. These may be checked every 5 years, or more frequently if you are over 32 years old.  Skin check.  Lung cancer screening. You may have this screening every year starting at age 82 if you have a 30-pack-year history of smoking and currently smoke or have quit within the past  15 years.  Fecal occult blood test (FOBT) of the stool. You may have this test every year starting at age 48.  Flexible sigmoidoscopy or colonoscopy. You may have a sigmoidoscopy every 5 years or a colonoscopy every 10 years starting at age 57.  Hepatitis C blood test.  Hepatitis B blood test.  Sexually transmitted disease (STD) testing.  Diabetes screening. This is done by checking your blood sugar (glucose) after you have not eaten for a while (fasting). You may have this done every 1-3 years.  Mammogram. This may be done every 1-2 years. Talk to your health care provider about when you should start having regular mammograms. This may depend on whether you have a family history of breast cancer.  BRCA-related cancer screening. This may be done if you have a family history of breast, ovarian, tubal, or peritoneal cancers.  Pelvic exam and Pap test. This may be done every 3 years starting at age 58. Starting at age 20, this may be done every 5 years if you have a Pap test in combination with an HPV test.  Bone density scan. This is done to screen for osteoporosis. You may have this scan if you are at high risk for osteoporosis.  Discuss your test results, treatment options, and if necessary, the need for more tests with your health care provider. Vaccines Your health care provider may recommend certain vaccines, such as:  Influenza vaccine. This is recommended every year.  Tetanus, diphtheria, and acellular pertussis (Tdap, Td) vaccine. You may need a Td  booster every 10 years.  Varicella vaccine. You may need this if you have not been vaccinated.  Zoster vaccine. You may need this after age 58.  Measles, mumps, and rubella (MMR) vaccine. You may need at least one dose of MMR if you were born in 1957 or later. You may also need a second dose.  Pneumococcal 13-valent conjugate (PCV13) vaccine. You may need this if you have certain conditions and were not previously  vaccinated.  Pneumococcal polysaccharide (PPSV23) vaccine. You may need one or two doses if you smoke cigarettes or if you have certain conditions.  Meningococcal vaccine. You may need this if you have certain conditions.  Hepatitis A vaccine. You may need this if you have certain conditions or if you travel or work in places where you may be exposed to hepatitis A.  Hepatitis B vaccine. You may need this if you have certain conditions or if you travel or work in places where you may be exposed to hepatitis B.  Haemophilus influenzae type b (Hib) vaccine. You may need this if you have certain conditions.  Talk to your health care provider about which screenings and vaccines you need and how often you need them. This information is not intended to replace advice given to you by your health care provider. Make sure you discuss any questions you have with your health care provider. Document Released: 02/20/2015 Document Revised: 10/14/2015 Document Reviewed: 11/25/2014 Elsevier Interactive Patient Education  [employer].

## 2017-06-23 NOTE — Progress Notes (Signed)
Subjective:  Tracey Leblanc is a 47 y.o. female who presents today for her annual comprehensive physical exam and to establish care  HPI:  She has no acute complaints today.   Lifestyle Diet: No specific diet.  Exercise: Walks daily. Plays soccer with son.  Depression screen [facility] 2/9 06/23/2017  Decreased Interest 0  Down, Depressed, Hopeless 0  PHQ - 2 Score 0  Altered sleeping 0  Tired, decreased energy 0  Change in appetite 0  Feeling bad or failure about yourself  0  Trouble concentrating 0  Moving slowly or fidgety/restless 0  Suicidal thoughts 0  PHQ-9 Score 0    Health Maintenance Due  Topic Date Due  . HIV Screening  07/16/1983  . PAP SMEAR  07/15/1989     ROS: A complete review of systems was negative.   PMH:  The following were reviewed and entered/updated in epic: Past Medical History:  Diagnosis Date  . Family history of bladder cancer   . Family history of liver cancer   . History of chicken pox   . UTI (urinary tract infection)    Patient Active Problem List   Diagnosis Date Noted  . Genetic testing 11/01/2016  . Family history of liver cancer   . Family history of bladder cancer   . Personal history of other malignant neoplasm of skin 06/26/2013  . HIP PAIN 02/05/2009  . HEMATOCHEZIA 07/22/2008  . NECK PAIN 07/22/2008  . CHICKENPOX 11/01/2006   Past Surgical History:  Procedure Laterality Date  . BREAST CYST ASPIRATION      Family History  Problem Relation Age of Onset  . Liver cancer Mother 50       d.61  . Bladder Cancer Father 66       diagnosed with second bladder cancer at 75  . COPD Father   . Hearing loss Father   . Heart attack Father   . Heart disease Father   . Kidney disease Father   . Alcohol abuse Brother   . Depression Brother   . Cancer Maternal Uncle        d.70s unspecified type of cancer  . Hearing loss Maternal Grandmother   . Heart disease Maternal Grandmother   . Cancer Maternal Grandfather    d.84 unspecified type of cancer  . Heart disease Paternal Grandmother   . Heart attack Paternal Grandmother   . Lung cancer Paternal Grandfather   . Testicular cancer Paternal Grandfather        d.85  . Hearing loss Paternal Grandfather     Medications- reviewed and updated No current outpatient medications on file.   No current facility-administered medications for this visit.     Allergies-reviewed and updated No Known Allergies  Social History   Socioeconomic History  . Marital status: Married    Spouse name: Not on file  . Number of children: Not on file  . Years of education: Not on file  . Highest education level: Not on file  Occupational History  . Not on file  Social Needs  . Financial resource strain: Not on file  . Food insecurity:    Worry: Not on file    Inability: Not on file  . Transportation needs:    Medical: Not on file    Non-medical: Not on file  Tobacco Use  . Smoking status: Never Smoker  . Smokeless tobacco: Never Used  Substance and Sexual Activity  . Alcohol use: Never    Frequency: Never  .  Drug use: Never  . Sexual activity: Not on file  Lifestyle  . Physical activity:    Days per week: Not on file    Minutes per session: Not on file  . Stress: Not on file  Relationships  . Social connections:    Talks on phone: Not on file    Gets together: Not on file    Attends religious service: Not on file    Active member of club or organization: Not on file    Attends meetings of clubs or organizations: Not on file    Relationship status: Not on file  Other Topics Concern  . Not on file  Social History Narrative  . Not on file   Objective:  Physical Exam: BP 98/80 (BP Location: Left Arm, Patient Position: Sitting, Cuff Size: Normal)   Pulse 73   Temp 98.7 F (37.1 C) (Oral)   Resp 16   Ht 5' 9.25" (1.759 m)   Wt 141 lb 2 oz (64 kg)   LMP 06/04/2017   SpO2 98%   BMI 20.69 kg/m   Body mass index is 20.69 kg/m. Wt Readings  from Last 3 Encounters:  06/23/17 141 lb 2 oz (64 kg)  05/18/17 142 lb (64.4 kg)   Gen: NAD, resting comfortably HEENT: TMs normal bilaterally. OP clear. No thyromegaly noted.  CV: RRR with no murmurs appreciated Pulm: NWOB, CTAB with no crackles, wheezes, or rhonchi GI: Normal bowel sounds present. Soft, Nontender, Nondistended. MSK: no edema, cyanosis, or clubbing noted Skin: warm, dry Neuro: CN2-12 grossly intact. Strength 5/5 in upper and lower extremities. Reflexes symmetric and intact bilaterally.  Psych: Normal affect and thought content  Assessment/Plan:  Chest pain Patient seen a few weeks ago in the ED for this. Work up was essentially negative. Symptoms have nearly resolved at this point. Will continue with watchful waiting.   Hyperglycemia Check A1c with next blood draw.  Preventative Healthcare: Up to date on Pap smear.  Patient will return for lipid panel and HIV screening.  Patient Counseling(The following topics were reviewed and/or handout was given):  -Nutrition: Stressed importance of moderation in sodium/caffeine intake, saturated fat and cholesterol, caloric balance, sufficient intake of fresh fruits, vegetables, and fiber.  -Stressed the importance of regular exercise.   -Substance Abuse: Discussed cessation/primary prevention of tobacco, alcohol, or other drug use; driving or other dangerous activities under the influence; availability of treatment for abuse.   -Injury prevention: Discussed safety belts, safety helmets, smoke detector, smoking near bedding or upholstery.   -Sexuality: Discussed sexually transmitted diseases, partner selection, use of condoms, avoidance of unintended pregnancy and contraceptive alternatives.   -Dental health: Discussed importance of regular tooth brushing, flossing, and dental visits.  -Health maintenance and immunizations reviewed. Please refer to Health maintenance section.  Return to care in 1 year for next preventative  visit.   Algis Greenhouse. Jerline Pain, MD 06/23/2017 11:21 AM

## 2017-06-30 ENCOUNTER — Other Ambulatory Visit (INDEPENDENT_AMBULATORY_CARE_PROVIDER_SITE_OTHER): Payer: 59

## 2017-06-30 ENCOUNTER — Other Ambulatory Visit: Payer: 59

## 2017-06-30 DIAGNOSIS — Z114 Encounter for screening for human immunodeficiency virus [HIV]: Secondary | ICD-10-CM | POA: Diagnosis not present

## 2017-06-30 DIAGNOSIS — Z13220 Encounter for screening for lipoid disorders: Secondary | ICD-10-CM | POA: Diagnosis not present

## 2017-06-30 DIAGNOSIS — R739 Hyperglycemia, unspecified: Secondary | ICD-10-CM

## 2017-06-30 LAB — HEMOGLOBIN A1C: HEMOGLOBIN A1C: 5.5 % (ref 4.6–6.5)

## 2017-06-30 LAB — LIPID PANEL
CHOLESTEROL: 173 mg/dL (ref 0–200)
HDL: 53.1 mg/dL (ref >39.00)
LDL Cholesterol: 109 mg/dL — ABNORMAL HIGH (ref 0–99)
NonHDL: 120.36
TRIGLYCERIDES: 56 mg/dL (ref 0.0–149.0)
Total CHOL/HDL Ratio: 3
VLDL: 11.2 mg/dL (ref 0.0–40.0)

## 2017-07-01 LAB — HIV ANTIBODY (ROUTINE TESTING W REFLEX): HIV: NONREACTIVE

## 2017-07-04 ENCOUNTER — Encounter: Payer: Self-pay | Admitting: Family Medicine

## 2017-07-04 DIAGNOSIS — E785 Hyperlipidemia, unspecified: Secondary | ICD-10-CM | POA: Insufficient documentation

## 2017-11-23 DIAGNOSIS — Z1231 Encounter for screening mammogram for malignant neoplasm of breast: Secondary | ICD-10-CM | POA: Diagnosis not present

## 2017-11-23 DIAGNOSIS — Z6820 Body mass index (BMI) 20.0-20.9, adult: Secondary | ICD-10-CM | POA: Diagnosis not present

## 2017-11-23 DIAGNOSIS — Z01419 Encounter for gynecological examination (general) (routine) without abnormal findings: Secondary | ICD-10-CM | POA: Diagnosis not present

## 2018-02-02 ENCOUNTER — Ambulatory Visit: Payer: 59 | Admitting: Family Medicine

## 2018-02-02 ENCOUNTER — Encounter: Payer: Self-pay | Admitting: Family Medicine

## 2018-02-02 VITALS — BP 104/62 | HR 76 | Temp 98.3°F | Ht 69.0 in | Wt 146.4 lb

## 2018-02-02 DIAGNOSIS — M25552 Pain in left hip: Secondary | ICD-10-CM

## 2018-02-02 DIAGNOSIS — M25551 Pain in right hip: Secondary | ICD-10-CM

## 2018-02-02 NOTE — Progress Notes (Signed)
   Subjective:  Tracey Leblanc is a 47 y.o. female who presents today for same-day appointment with a chief complaint of back pain.   HPI:  Back Pain, Acute problem Started several months ago.  Located in bilateral lower back.  Pain comes and goes.  Radiates to her outer hips bilaterally.  She is concerned that it may be a kidney issue.  Describes a painful sensation.  No electrical-like sensations.  No obvious precipitating events.  No obvious alleviating or aggravating factors.  Pain occurs randomly.  No dysuria.  No hematuria.  No urinary frequency.  No weakness or numbness.  ROS: Per HPI  Objective:  Physical Exam: BP 104/62 (BP Location: Left Arm, Patient Position: Sitting, Cuff Size: Normal)   Pulse 76   Temp 98.3 F (36.8 C) (Oral)   Ht 5\' 9"  (1.753 m)   Wt 146 lb 6.4 oz (66.4 kg)   SpO2 96%   BMI 21.62 kg/m   Gen: NAD, resting comfortably MSK: -Back: No deformities.  Nontender to palpation. -Lower extremities: Strength 4 out of 5 with bilateral hip abduction, otherwise 5 out of 5 in all fields.  Sensation light touch intact throughout.  Neurovascular intact distally.  No pain with Faber maneuver.  Resisted hip abduction reproduces her pain.  Assessment/Plan:  Bilateral Hip Pain No red flags.  She has weakness with hip abduction and pain in this area, but no other deficits on her exam.  Will treat conservatively with home exercise program focused on strengthening hip muscles including piriformis and gluteal musculature.  Handout was given detailing these exercises.  Recommended ibuprofen or naproxen as needed.  Discussed reasons for return to care.  Consider referral to PT and/or sports medicine if not improving with conservative management.  Algis Greenhouse. Jerline Pain, MD 02/02/2018 8:11 AM

## 2018-02-02 NOTE — Patient Instructions (Signed)
It was very nice to see you today!  You have inflammation and weakness in the muscles in your hip.  Please work on the exercises.  It is okay for you to take ibuprofen or naproxen as needed.  Please let him know if your symptoms worsen or do not improve the next few weeks.  Take care, Dr Jerline Pain

## 2018-04-12 DIAGNOSIS — L821 Other seborrheic keratosis: Secondary | ICD-10-CM | POA: Diagnosis not present

## 2018-04-12 DIAGNOSIS — D2262 Melanocytic nevi of left upper limb, including shoulder: Secondary | ICD-10-CM | POA: Diagnosis not present

## 2018-04-12 DIAGNOSIS — D225 Melanocytic nevi of trunk: Secondary | ICD-10-CM | POA: Diagnosis not present

## 2018-04-12 DIAGNOSIS — D1801 Hemangioma of skin and subcutaneous tissue: Secondary | ICD-10-CM | POA: Diagnosis not present

## 2018-08-17 DIAGNOSIS — R3915 Urgency of urination: Secondary | ICD-10-CM | POA: Diagnosis not present

## 2018-08-17 DIAGNOSIS — N760 Acute vaginitis: Secondary | ICD-10-CM | POA: Diagnosis not present

## 2018-08-17 DIAGNOSIS — L299 Pruritus, unspecified: Secondary | ICD-10-CM | POA: Diagnosis not present

## 2018-08-27 DIAGNOSIS — R309 Painful micturition, unspecified: Secondary | ICD-10-CM | POA: Diagnosis not present

## 2018-08-27 DIAGNOSIS — R3915 Urgency of urination: Secondary | ICD-10-CM | POA: Diagnosis not present

## 2018-08-27 DIAGNOSIS — R300 Dysuria: Secondary | ICD-10-CM | POA: Diagnosis not present

## 2018-10-19 ENCOUNTER — Encounter: Payer: 59 | Admitting: Family Medicine

## 2018-10-19 DIAGNOSIS — J0140 Acute pansinusitis, unspecified: Secondary | ICD-10-CM | POA: Diagnosis not present

## 2018-10-27 DIAGNOSIS — R05 Cough: Secondary | ICD-10-CM | POA: Diagnosis not present

## 2018-11-02 ENCOUNTER — Ambulatory Visit (INDEPENDENT_AMBULATORY_CARE_PROVIDER_SITE_OTHER): Payer: 59 | Admitting: Family Medicine

## 2018-11-02 ENCOUNTER — Encounter: Payer: Self-pay | Admitting: Family Medicine

## 2018-11-02 DIAGNOSIS — R05 Cough: Secondary | ICD-10-CM

## 2018-11-02 DIAGNOSIS — R059 Cough, unspecified: Secondary | ICD-10-CM

## 2018-11-02 MED ORDER — PROMETHAZINE-DM 6.25-15 MG/5ML PO SYRP: 5.0000 mL | ORAL_SOLUTION | Freq: Four times a day (QID) | ORAL | 0 refills | Status: DC | PRN

## 2018-11-02 NOTE — Progress Notes (Signed)
    Chief Complaint:  MADINE WOODRUM is a 48 y.o. female who presents for a telephone visit with a chief complaint of cough.   Assessment/Plan:  Cough No red flags.  Likely postinfectious cough.  Will send in promethazine-guaifenesin cough syrup.  Recommended over-the-counter honey and Broncolin as needed.  Discussed reasons to return to care.  If cough persists for a few more weeks or if symptoms worsen, will need chest x-ray.    Subjective:  HPI:  Cough Started about 3 weeks ago. Had rhinorrhea and some jaw pain. Went to urgent care and was started on augmentinfor a sinus infection.  Her jaw pain and pressure improved however he started developing a cough.Last Saturday went back to urgent care and was diagnosed with bronchitis. She was started on prednisone which helped with the cough.  She has not had any fever or chills.  No fatigue.  Cough is nonproductive.  She has tried some other over-the-counter medications with no significant provement.  Symptoms seem to be worse during the day.  No other obvious alleviating or aggravating factors.  ROS: Per HPI  PMH: She reports that she has never smoked. She has never used smokeless tobacco. She reports that she does not drink alcohol or use drugs.      Objective/Observations   No results found for this or any previous visit (from the past 72 hour(s)).   Telephone Visit   I connected with Annita Brod on 11/02/18 at  3:40 PM EDT via telephone and verified that I am speaking with the correct person using two identifiers. I discussed the limitations of evaluation and management by telemedicine and the availability of in person appointments. The patient expressed understanding and agreed to proceed.   Patient location: Home Provider location: [place] participating in the virtual visit: Myself and Patient      Algis Greenhouse. Jerline Pain, MD 11/02/2018 11:05 AM

## 2018-11-07 ENCOUNTER — Encounter: Payer: 59 | Admitting: Family Medicine

## 2019-01-16 DIAGNOSIS — Z01419 Encounter for gynecological examination (general) (routine) without abnormal findings: Secondary | ICD-10-CM | POA: Diagnosis not present

## 2019-01-16 DIAGNOSIS — Z6821 Body mass index (BMI) 21.0-21.9, adult: Secondary | ICD-10-CM | POA: Diagnosis not present

## 2019-01-16 DIAGNOSIS — Z1231 Encounter for screening mammogram for malignant neoplasm of breast: Secondary | ICD-10-CM | POA: Diagnosis not present

## 2019-02-18 ENCOUNTER — Encounter: Payer: Self-pay | Admitting: Gastroenterology

## 2019-03-22 ENCOUNTER — Other Ambulatory Visit: Payer: Self-pay

## 2019-03-22 ENCOUNTER — Ambulatory Visit (AMBULATORY_SURGERY_CENTER): Payer: 59

## 2019-03-22 VITALS — Ht 69.0 in | Wt 145.0 lb

## 2019-03-22 DIAGNOSIS — Z1211 Encounter for screening for malignant neoplasm of colon: Secondary | ICD-10-CM

## 2019-03-22 DIAGNOSIS — Z01818 Encounter for other preprocedural examination: Secondary | ICD-10-CM

## 2019-03-22 MED ORDER — NA SULFATE-K SULFATE-MG SULF 17.5-3.13-1.6 GM/177ML PO SOLN: 1.0000 | Freq: Once | ORAL | 0 refills | Status: AC

## 2019-03-22 NOTE — Progress Notes (Signed)
No egg or soy allergy known to patient  No issues with past sedation with any surgeries  or procedures, no intubation problems  No diet pills per patient No home 02 use per patient  No blood thinners per patient  Pt denies issues with constipation  No A fib or A flutter  EMMI video sent to pt's e mail   Virtual visit    Due to the COVID-19 pandemic we are asking patients to follow these guidelines. Please only bring one care partner. Please be aware that your care partner may wait in the car in the parking lot or if they feel like they will be too hot to wait in the car, they may wait in the lobby on the 4th floor. All care partners are required to wear a mask the entire time (we do not have any that we can provide them), they need to practice social distancing, and we will do a Covid check for all patient's and care partners when you arrive. Also we will check their temperature and your temperature. If the care partner waits in their car they need to stay in the parking lot the entire time and we will call them on their cell phone when the patient is ready for discharge so they can bring the car to the front of the building. Also all patient's will need to wear a mask into building.

## 2019-03-28 ENCOUNTER — Encounter: Payer: Self-pay | Admitting: Gastroenterology

## 2019-03-29 ENCOUNTER — Encounter: Payer: 59 | Admitting: Family Medicine

## 2019-04-02 ENCOUNTER — Ambulatory Visit (INDEPENDENT_AMBULATORY_CARE_PROVIDER_SITE_OTHER): Payer: 59

## 2019-04-02 ENCOUNTER — Other Ambulatory Visit: Payer: Self-pay | Admitting: Gastroenterology

## 2019-04-02 DIAGNOSIS — Z1159 Encounter for screening for other viral diseases: Secondary | ICD-10-CM | POA: Diagnosis not present

## 2019-04-02 LAB — SARS CORONAVIRUS 2 (TAT 6-24 HRS): SARS Coronavirus 2: NEGATIVE

## 2019-04-05 ENCOUNTER — Ambulatory Visit (AMBULATORY_SURGERY_CENTER): Payer: 59 | Admitting: Gastroenterology

## 2019-04-05 ENCOUNTER — Encounter: Payer: Self-pay | Admitting: Gastroenterology

## 2019-04-05 ENCOUNTER — Other Ambulatory Visit: Payer: Self-pay

## 2019-04-05 VITALS — BP 100/62 | HR 78 | Temp 98.2°F | Resp 15 | Ht 69.0 in | Wt 145.0 lb

## 2019-04-05 DIAGNOSIS — Z1211 Encounter for screening for malignant neoplasm of colon: Secondary | ICD-10-CM | POA: Diagnosis not present

## 2019-04-05 MED ORDER — SODIUM CHLORIDE 0.9 % IV SOLN: 500.0000 mL | Freq: Once | INTRAVENOUS | Status: DC

## 2019-04-05 NOTE — Op Note (Signed)
[facility] Patient Name: Tracey Leblanc Procedure Date: 04/05/2019 8:03 AM MRN: [id number] Endoscopist: Thornton Park MD, MD Age: 49 Referring MD:  Date of Birth: [birth year] Gender: Female Account #: [id number] Procedure:                Colonoscopy Indications:              Screening for colorectal malignant neoplasm                           Normal colonoscopy at age 73 at 89                           No known family history of colon cancer or polyps Medicines:                Monitored Anesthesia Care Procedure:                Pre-Anesthesia Assessment:                           - Prior to the procedure, a History and Physical                            was performed, and patient medications and                            allergies were reviewed. The patient's tolerance of                            previous anesthesia was also reviewed. The risks                            and benefits of the procedure and the sedation                            options and risks were discussed with the patient.                            All questions were answered, and informed consent                            was obtained. Prior Anticoagulants: The patient has                            taken no previous anticoagulant or antiplatelet                            agents. ASA Grade Assessment: I - A normal, healthy                            patient. After reviewing the risks and benefits,                            the patient was deemed in satisfactory condition to  undergo the procedure.                           After obtaining informed consent, the colonoscope                            was passed under direct vision. Throughout the                            procedure, the patient's blood pressure, pulse, and                            oxygen saturations were monitored continuously. The                            Colonoscope was introduced through the  anus and                            advanced to the 3 cm into the ileum. A second                            forward view of the right colon was performed. The                            colonoscopy was performed without difficulty. The                            patient tolerated the procedure well. The quality                            of the bowel preparation was excellent. The                            terminal ileum, ileocecal valve, appendiceal                            orifice, and rectum were photographed. Scope In: 8:08:51 AM Scope Out: 8:22:30 AM Scope Withdrawal Time: 0 hours 10 minutes 19 seconds  Total Procedure Duration: 0 hours 13 minutes 39 seconds  Findings:                 The perianal and digital rectal examinations were                            normal.                           A few small-mouthed diverticula were found in the                            sigmoid colon.                           The exam was otherwise without abnormality on  direct and retroflexion views. Complications:            No immediate complications. Estimated Blood Loss:     Estimated blood loss: none. Impression:               - Diverticulosis in the sigmoid colon.                           - The examination was otherwise normal on direct                            and retroflexion views.                           - No specimens collected. Recommendation:           - Patient has a contact number available for                            emergencies. The signs and symptoms of potential                            delayed complications were discussed with the                            patient. Return to normal activities tomorrow.                            Written discharge instructions were provided to the                            patient.                           - High fiber diet recommended. Drink at least 64                            ounces of water  daily. Consider a daily stool                            bulking agent such as Metamucil.                           - Emerging evidence supports eating a diet of                            fruits, vegentables, grains, calcium and yogurt                            while reducing red meat and alcohol may reduce the                            risk of colon cancer.                           - Continue present medications.                           -  Repeat colonoscopy in 10 years for screening                            purposes, earlier with the development of any new                            symptoms.                           - Thank you for the opportunity to be involved in                            your colon cancer prevention strategy. Thornton Park MD, MD 04/05/2019 8:29:25 AM This report has been signed electronically.

## 2019-04-05 NOTE — Progress Notes (Signed)
Temp=[place]

## 2019-04-05 NOTE — Patient Instructions (Signed)
Handout given for high fiber diet.  YOU HAD AN ENDOSCOPIC PROCEDURE TODAY AT [facility]:   Refer to the procedure report that was given to you for any specific questions about what was found during the examination.  If the procedure report does not answer your questions, please call your gastroenterologist to clarify.  If you requested that your care partner not be given the details of your procedure findings, then the procedure report has been included in a sealed envelope for you to review at your convenience later.  YOU SHOULD EXPECT: Some feelings of bloating in the abdomen. Passage of more gas than usual.  Walking can help get rid of the air that was put into your GI tract during the procedure and reduce the bloating. If you had a lower endoscopy (such as a colonoscopy or flexible sigmoidoscopy) you may notice spotting of blood in your stool or on the toilet paper. If you underwent a bowel prep for your procedure, you may not have a normal bowel movement for a few days.  Please Note:  You might notice some irritation and congestion in your nose or some drainage.  This is from the oxygen used during your procedure.  There is no need for concern and it should clear up in a day or so.  SYMPTOMS TO REPORT IMMEDIATELY:   Following lower endoscopy (colonoscopy or flexible sigmoidoscopy):  Excessive amounts of blood in the stool  Significant tenderness or worsening of abdominal pains  Swelling of the abdomen that is new, acute  Fever of 100F or higher  For urgent or emergent issues, a gastroenterologist can be reached at any hour by calling [phone].   DIET:  We do recommend a small meal at first, but then you may proceed to your regular diet.  Drink plenty of fluids but you should avoid alcoholic beverages for 24 hours.  ACTIVITY:  You should plan to take it easy for the rest of today and you should NOT DRIVE or use heavy machinery until tomorrow (because of the sedation  medicines used during the test).    FOLLOW UP: Our staff will call the number listed on your records 48-72 hours following your procedure to check on you and address any questions or concerns that you may have regarding the information given to you following your procedure. If we do not reach you, we will leave a message.  We will attempt to reach you two times.  During this call, we will ask if you have developed any symptoms of COVID 19. If you develop any symptoms (ie: fever, flu-like symptoms, shortness of breath, cough etc.) before then, please call [phone].  If you test positive for Covid 19 in the 2 weeks post procedure, please call and report this information to [place].    If any biopsies were taken you will be contacted by phone or by letter within the next 1-3 weeks.  Please call us at [phone] if you have not heard about the biopsies in 3 weeks.    SIGNATURES/CONFIDENTIALITY: You and/or your care partner have signed paperwork which will be entered into your electronic medical record.  These signatures attest to the fact that that the information above on your After Visit Summary has been reviewed and is understood.  Full responsibility of the confidentiality of this discharge information lies with you and/or your care-partner.

## 2019-04-05 NOTE — Progress Notes (Signed)
PT taken to PACU. Monitors in place. VSS. Report given to RN. 

## 2019-04-09 ENCOUNTER — Telehealth: Payer: Self-pay

## 2019-04-09 IMAGING — CR DG CHEST 2V
2 series · 2 of 2 positions shown · non-contrast
Comparison: None.

CLINICAL DATA: Intermittent chest pain and shortness of breath for
the past few weeks.

EXAM:
CHEST - 2 VIEW

[w chest pa]
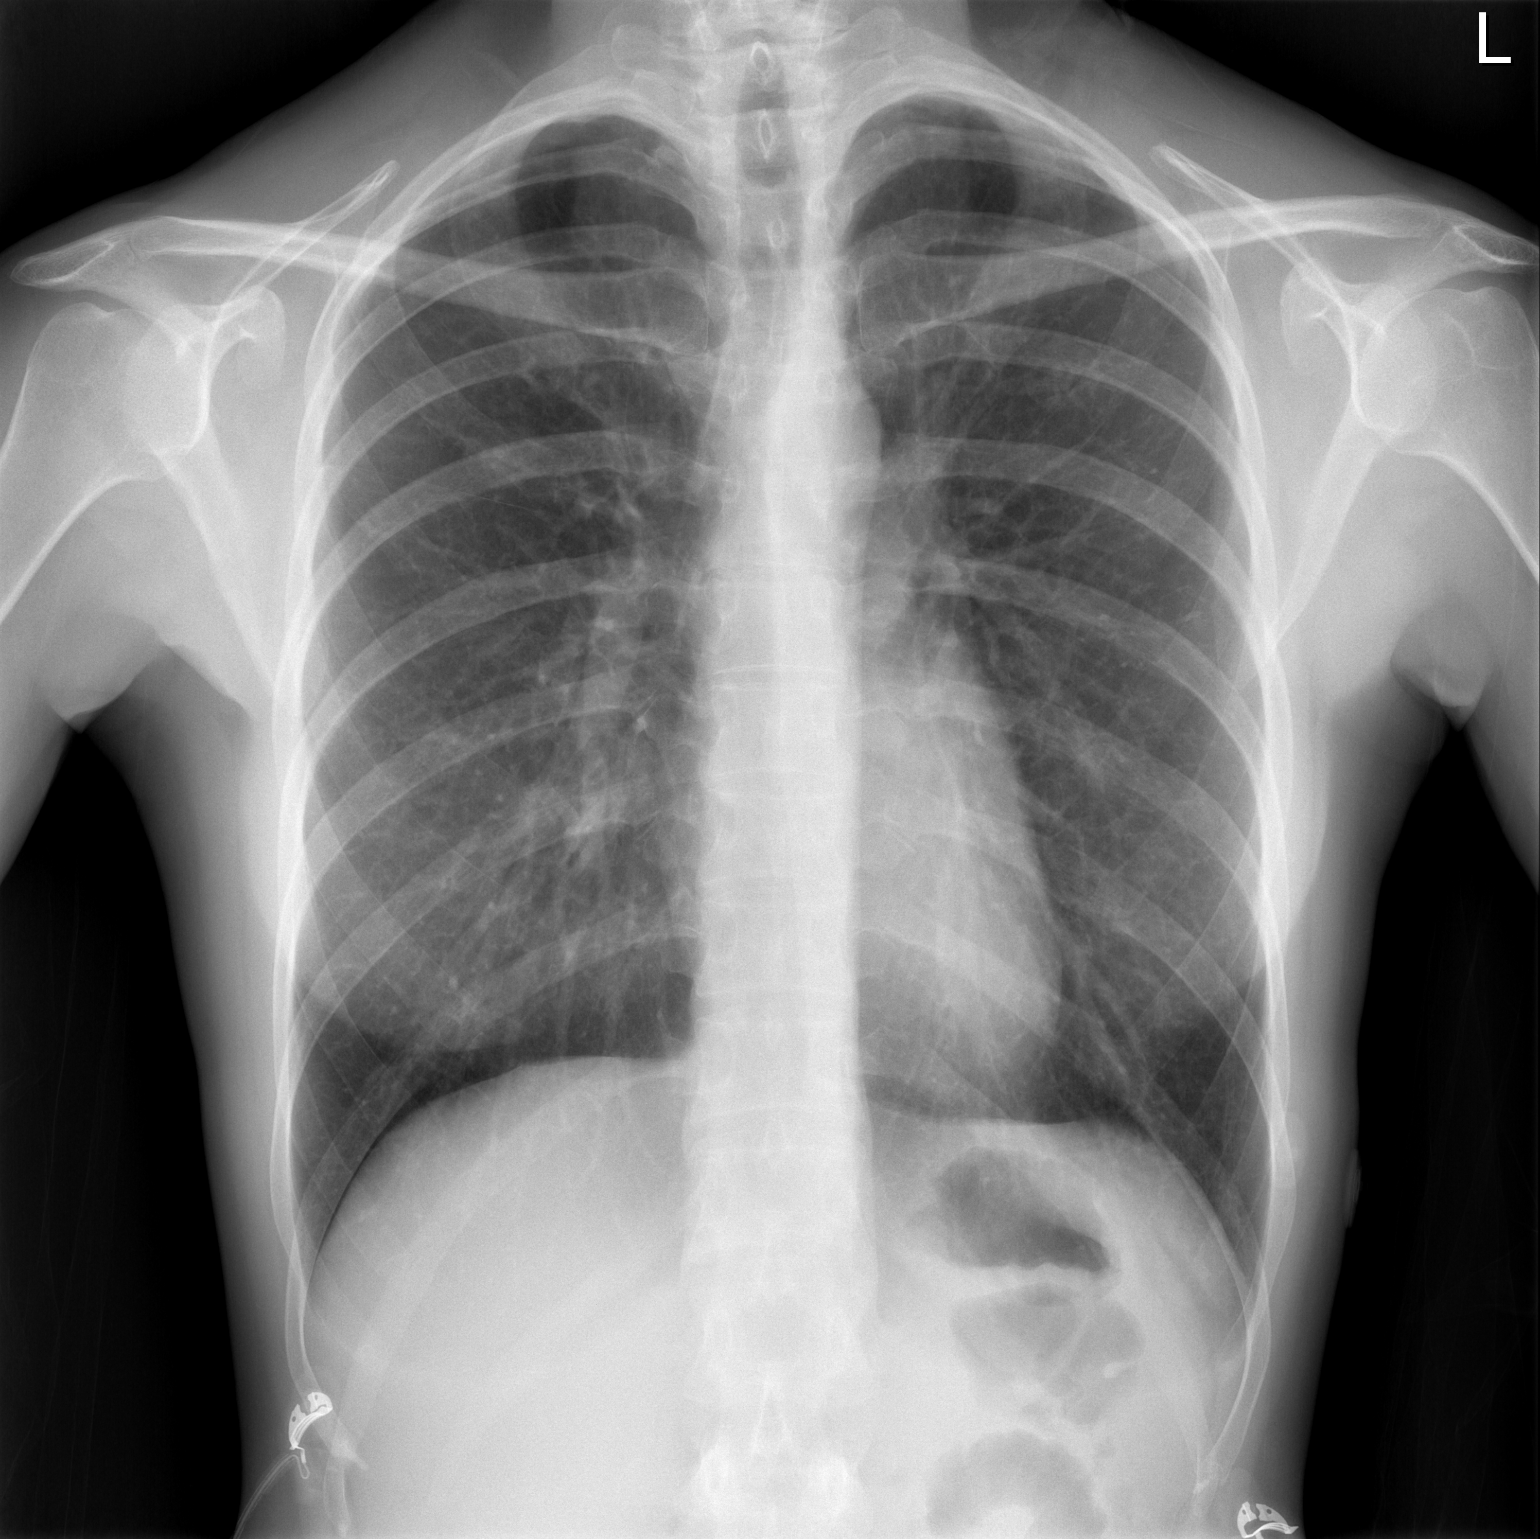

[w chest lat]
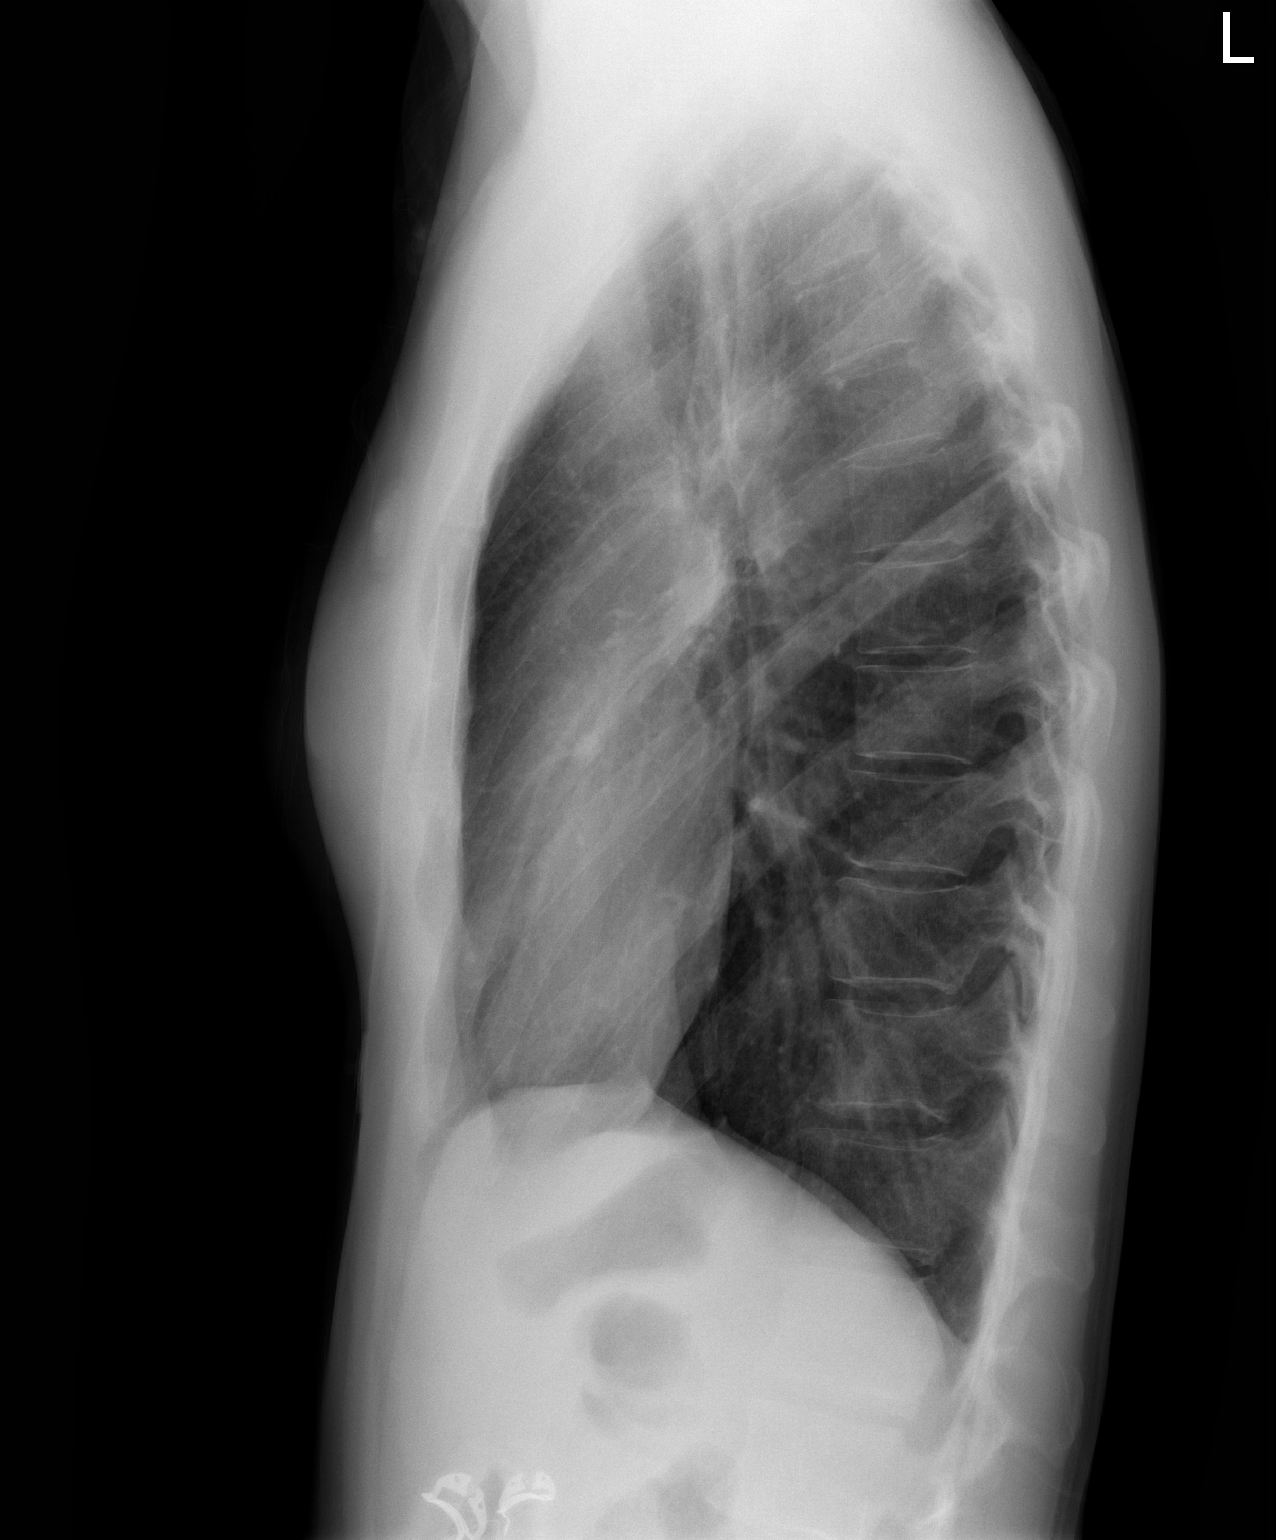

[2 of 2 positions shown; findings below may reference images not displayed]

FINDINGS: The heart size and mediastinal contours are within normal limits.
Normal pulmonary vascularity. No focal consolidation, pleural
effusion, or pneumothorax. No acute osseous abnormality.
IMPRESSION: Normal chest x-ray.

## 2019-04-09 NOTE — Telephone Encounter (Signed)
  Follow up Call-  Call back number 04/05/2019  Post procedure Call Back phone  # [phone]  Permission to leave phone message Yes  Some recent data might be hidden     Patient questions:  Do you have a fever, pain , or abdominal swelling? No. Pain Score  0 *  Have you tolerated food without any problems? Yes.    Have you been able to return to your normal activities? Yes.    Do you have any questions about your discharge instructions: Diet   No. Medications  No. Follow up visit  No.  Do you have questions or concerns about your Care? No.  Actions: * If pain score is 4 or above: No action needed, pain <4.   1. Have you developed a fever since your procedure? No  2.   Have you had an respiratory symptoms (SOB or cough) since your procedure? No  3.   Have you tested positive for COVID 19 since your procedure No  4.   Have you had any family members/close contacts diagnosed with the COVID 19 since your procedure?  No   If yes to any of these questions please route to Joylene John, RN and Alphonsa Gin, RN.

## 2019-04-26 ENCOUNTER — Encounter: Payer: 59 | Admitting: Family Medicine

## 2019-05-09 ENCOUNTER — Encounter: Payer: Self-pay | Admitting: Family Medicine

## 2019-05-09 ENCOUNTER — Ambulatory Visit (INDEPENDENT_AMBULATORY_CARE_PROVIDER_SITE_OTHER): Payer: 59 | Admitting: Family Medicine

## 2019-05-09 ENCOUNTER — Other Ambulatory Visit: Payer: Self-pay

## 2019-05-09 VITALS — BP 108/68 | HR 74 | Temp 98.3°F | Ht 69.0 in | Wt 145.4 lb

## 2019-05-09 DIAGNOSIS — E785 Hyperlipidemia, unspecified: Secondary | ICD-10-CM

## 2019-05-09 DIAGNOSIS — M542 Cervicalgia: Secondary | ICD-10-CM

## 2019-05-09 DIAGNOSIS — Z0001 Encounter for general adult medical examination with abnormal findings: Secondary | ICD-10-CM | POA: Diagnosis not present

## 2019-05-09 DIAGNOSIS — R739 Hyperglycemia, unspecified: Secondary | ICD-10-CM

## 2019-05-09 LAB — COMPREHENSIVE METABOLIC PANEL
ALT: 10 U/L (ref 0–35)
AST: 11 U/L (ref 0–37)
Albumin: 4.4 g/dL (ref 3.5–5.2)
Alkaline Phosphatase: 41 U/L (ref 39–117)
BUN: 13 mg/dL (ref 6–23)
CO2: 27 mEq/L (ref 19–32)
Calcium: 9.2 mg/dL (ref 8.4–10.5)
Chloride: 105 mEq/L (ref 96–112)
Creatinine, Ser: 0.78 mg/dL (ref 0.40–1.20)
GFR: 77.92 mL/min (ref >60.00)
Glucose, Bld: 90 mg/dL (ref 70–99)
Potassium: 4.7 mEq/L (ref 3.5–5.1)
Sodium: 138 mEq/L (ref 135–145)
Total Bilirubin: 0.8 mg/dL (ref 0.2–1.2)
Total Protein: 6.5 g/dL (ref 6.0–8.3)

## 2019-05-09 LAB — LIPID PANEL
Cholesterol: 170 mg/dL (ref 0–200)
HDL: 47.5 mg/dL (ref >39.00)
LDL Cholesterol: 109 mg/dL — ABNORMAL HIGH (ref 0–99)
NonHDL: 122.1
Total CHOL/HDL Ratio: 4
Triglycerides: 66 mg/dL (ref 0.0–149.0)
VLDL: 13.2 mg/dL (ref 0.0–40.0)

## 2019-05-09 LAB — HEMOGLOBIN A1C: Hgb A1c MFr Bld: 5.4 % (ref 4.6–6.5)

## 2019-05-09 LAB — CBC
HCT: 40.5 % (ref 36.0–46.0)
Hemoglobin: 13.4 g/dL (ref 12.0–15.0)
MCHC: 33.1 g/dL (ref 30.0–36.0)
MCV: 84.6 fl (ref 78.0–100.0)
Platelets: 256 10*3/uL (ref 150.0–400.0)
RBC: 4.79 Mil/uL (ref 3.87–5.11)
RDW: 14.4 % (ref 11.5–15.5)
WBC: 5.1 10*3/uL (ref 4.0–10.5)

## 2019-05-09 LAB — TSH: TSH: 3.46 u[IU]/mL (ref 0.35–4.50)

## 2019-05-09 NOTE — Patient Instructions (Signed)
It was very nice to see you today!  Please use diclofenac/Voltaren for your neck as needed.  Work on the stretches in the  [employer].  We will check blood work today.  Come back in 1 year for your next physical, or sooner if needed.  Take care, Dr Jerline Pain  Please try these tips to maintain a healthy lifestyle:   Eat at least 3 REAL meals and 1-2 snacks per day.  Aim for no more than 5 hours between eating.  If you eat breakfast, please do so within one hour of getting up.    Each meal should contain half fruits/vegetables, one quarter protein, and one quarter carbs (no bigger than a computer mouse)   Cut down on sweet beverages. This includes juice, soda, and sweet tea.     Drink at least 1 glass of water with each meal and aim for at least 8 glasses per day   Exercise at least 150 minutes every week.    Preventive Care 7-66 Years Old, Female Preventive care refers to visits with your health care provider and lifestyle choices that can promote health and wellness. This includes:  A yearly physical exam. This may also be called an annual well check.  Regular dental visits and eye exams.  Immunizations.  Screening for certain conditions.  Healthy lifestyle choices, such as eating a healthy diet, getting regular exercise, not using drugs or products that contain nicotine and tobacco, and limiting alcohol use. What can I expect for my preventive care visit? Physical exam Your health care provider will check your:  Height and weight. This may be used to calculate body mass index (BMI), which tells if you are at a healthy weight.  Heart rate and blood pressure.  Skin for abnormal spots. Counseling Your health care provider may ask you questions about your:  Alcohol, tobacco, and drug use.  Emotional well-being.  Home and relationship well-being.  Sexual activity.  Eating habits.  Work and work Statistician.  Method of birth control.  Menstrual  cycle.  Pregnancy history. What immunizations do I need?  Influenza (flu) vaccine  This is recommended every year. Tetanus, diphtheria, and pertussis (Tdap) vaccine  You may need a Td booster every 10 years. Varicella (chickenpox) vaccine  You may need this if you have not been vaccinated. Zoster (shingles) vaccine  You may need this after age 17. Measles, mumps, and rubella (MMR) vaccine  You may need at least one dose of MMR if you were born in 1957 or later. You may also need a second dose. Pneumococcal conjugate (PCV13) vaccine  You may need this if you have certain conditions and were not previously vaccinated. Pneumococcal polysaccharide (PPSV23) vaccine  You may need one or two doses if you smoke cigarettes or if you have certain conditions. Meningococcal conjugate (MenACWY) vaccine  You may need this if you have certain conditions. Hepatitis A vaccine  You may need this if you have certain conditions or if you travel or work in places where you may be exposed to hepatitis A. Hepatitis B vaccine  You may need this if you have certain conditions or if you travel or work in places where you may be exposed to hepatitis B. Haemophilus influenzae type b (Hib) vaccine  You may need this if you have certain conditions. Human papillomavirus (HPV) vaccine  If recommended by your health care provider, you may need three doses over 6 months. You may receive vaccines as individual doses or as more than one  vaccine together in one shot (combination vaccines). Talk with your health care provider about the risks and benefits of combination vaccines. What tests do I need? Blood tests  Lipid and cholesterol levels. These may be checked every 5 years, or more frequently if you are over 6 years old.  Hepatitis C test.  Hepatitis B test. Screening  Lung cancer screening. You may have this screening every year starting at age 75 if you have a 30-pack-year history of smoking and  currently smoke or have quit within the past 15 years.  Colorectal cancer screening. All adults should have this screening starting at age 33 and continuing until age 48. Your health care provider may recommend screening at age 73 if you are at increased risk. You will have tests every 1-10 years, depending on your results and the type of screening test.  Diabetes screening. This is done by checking your blood sugar (glucose) after you have not eaten for a while (fasting). You may have this done every 1-3 years.  Mammogram. This may be done every 1-2 years. Talk with your health care provider about when you should start having regular mammograms. This may depend on whether you have a family history of breast cancer.  BRCA-related cancer screening. This may be done if you have a family history of breast, ovarian, tubal, or peritoneal cancers.  Pelvic exam and Pap test. This may be done every 3 years starting at age 32. Starting at age 41, this may be done every 5 years if you have a Pap test in combination with an HPV test. Other tests  Sexually transmitted disease (STD) testing.  Bone density scan. This is done to screen for osteoporosis. You may have this scan if you are at high risk for osteoporosis. Follow these instructions at home: Eating and drinking  Eat a diet that includes fresh fruits and vegetables, whole grains, lean protein, and low-fat dairy.  Take vitamin and mineral supplements as recommended by your health care provider.  Do not drink alcohol if: ? Your health care provider tells you not to drink. ? You are pregnant, may be pregnant, or are planning to become pregnant.  If you drink alcohol: ? Limit how much you have to 0-1 drink a day. ? Be aware of how much alcohol is in your drink. In the [place] one drink equals one 12 oz bottle of beer (355 mL), one 5 oz glass of wine (148 mL), or one 1 oz glass of hard liquor (44 mL). Lifestyle  Take daily care of your teeth and  gums.  Stay active. Exercise for at least 30 minutes on 5 or more days each week.  Do not use any products that contain nicotine or tobacco, such as cigarettes, e-cigarettes, and chewing tobacco. If you need help quitting, ask your health care provider.  If you are sexually active, practice safe sex. Use a condom or other form of birth control (contraception) in order to prevent pregnancy and STIs (sexually transmitted infections).  If told by your health care provider, take low-dose aspirin daily starting at age 46. What's next?  Visit your health care provider once a year for a well check visit.  Ask your health care provider how often you should have your eyes and teeth checked.  Stay up to date on all vaccines. This information is not intended to replace advice given to you by your health care provider. Make sure you discuss any questions you have with your health care provider. Document  Revised: 10/05/2017 Document Reviewed: 10/05/2017 Elsevier Patient Education  [place].

## 2019-05-09 NOTE — Progress Notes (Signed)
Chief Complaint:  Tracey Leblanc is a 49 y.o. female who presents today for her annual comprehensive physical exam.    Assessment/Plan:  New/Acute Problems: Neck Pain No red flags.  Likely muscular strain.  Discussed home exercises.  Patient deferred x-ray for today.  Recommended topical Voltaren as needed as well.  Discussed reasons to return to care.  Chronic Problems Addressed Today: Dyslipidemia Check lipid panel, CBC, C met, TSH.  Hyperglycemia Check A1c.  Preventative Healthcare: Gets mammogram and Pap smear via GYN.  Will check CBC, C met, TSH, and lipid panel today.  Patient Counseling(The following topics were reviewed and/or handout was given):  -Nutrition: Stressed importance of moderation in sodium/caffeine intake, saturated fat and cholesterol, caloric balance, sufficient intake of fresh fruits, vegetables, and fiber.  -Stressed the importance of regular exercise.   -Substance Abuse: Discussed cessation/primary prevention of tobacco, alcohol, or other drug use; driving or other dangerous activities under the influence; availability of treatment for abuse.   -Injury prevention: Discussed safety belts, safety helmets, smoke detector, smoking near bedding or upholstery.   -Sexuality: Discussed sexually transmitted diseases, partner selection, use of condoms, avoidance of unintended pregnancy and contraceptive alternatives.   -Dental health: Discussed importance of regular tooth brushing, flossing, and dental visits.  -Health maintenance and immunizations reviewed. Please refer to Health maintenance section.  Return to care in 1 year for next preventative visit.     Subjective:  HPI:  She has had neck pain for the past couple of days.  This is a recurrent issue that happens every few months.  Is been well for several years.  Usually takes Advil which helps.  Pain located in upper back and lower neck.  Worse with certain motions.  No injuries or obvious precipitating  events.  Lifestyle Diet: Tries to eat a balanced diet.  Exercise: Likes to go on walks.   Depression screen [facility] 2/9 05/09/2019  Decreased Interest 0  Down, Depressed, Hopeless 0  PHQ - 2 Score 0  Altered sleeping 0  Tired, decreased energy 0  Change in appetite 0  Feeling bad or failure about yourself  0  Trouble concentrating 0  Moving slowly or fidgety/restless 0  Suicidal thoughts 0  PHQ-9 Score 0  Difficult doing work/chores Not difficult at all    Health Maintenance Due  Topic Date Due  . PAP SMEAR-Modifier  Never done  . MAMMOGRAM  Never done     ROS: Per HPI, otherwise a complete review of systems was negative.   PMH:  The following were reviewed and entered/updated in epic: Past Medical History:  Diagnosis Date  . Allergy    seasonal  . Family history of bladder cancer   . Family history of liver cancer   . History of chicken pox   . UTI (urinary tract infection)    Patient Active Problem List   Diagnosis Date Noted  . Dyslipidemia 07/04/2017  . Genetic testing 11/01/2016  . Family history of liver cancer   . Family history of bladder cancer   . Personal history of other malignant neoplasm of skin 06/26/2013  . HEMATOCHEZIA 07/22/2008  . CHICKENPOX 11/01/2006   Past Surgical History:  Procedure Laterality Date  . BREAST CYST ASPIRATION    . COLONOSCOPY  08/08/2008  . DILATION AND CURETTAGE OF UTERUS  2002   3 miscarriages    Family History  Problem Relation Age of Onset  . Liver cancer Mother 60       d.61  .  Bladder Cancer Father 71       diagnosed with second bladder cancer at 21  . COPD Father   . Hearing loss Father   . Heart attack Father   . Heart disease Father   . Kidney disease Father   . Alcohol abuse Brother   . Depression Brother   . Cancer Maternal Uncle        d.70s unspecified type of cancer  . Hearing loss Maternal Grandmother   . Heart disease Maternal Grandmother   . Cancer Maternal Grandfather        d.84  unspecified type of cancer  . Heart disease Paternal Grandmother   . Heart attack Paternal Grandmother   . Lung cancer Paternal Grandfather   . Testicular cancer Paternal Grandfather        d.85  . Hearing loss Paternal Grandfather   . Colon cancer Neg Hx   . Colon polyps Neg Hx   . Esophageal cancer Neg Hx   . Rectal cancer Neg Hx   . Stomach cancer Neg Hx     Medications- reviewed and updated Current Outpatient Medications  Medication Sig Dispense Refill  . calcium carbonate (OS-CAL) 600 MG tablet Take 600 mg by mouth daily.    Marland Kitchen levocetirizine (XYZAL) 5 MG tablet Xyzal     No current facility-administered medications for this visit.    Allergies-reviewed and updated No Known Allergies  Social History   Socioeconomic History  . Marital status: Legally Separated    Spouse name: Not on file  . Number of children: Not on file  . Years of education: Not on file  . Highest education level: Not on file  Occupational History  . Not on file  Tobacco Use  . Smoking status: Never Smoker  . Smokeless tobacco: Never Used  Substance and Sexual Activity  . Alcohol use: Never  . Drug use: Never  . Sexual activity: Not on file  Other Topics Concern  . Not on file  Social History Narrative  . Not on file   Social Determinants of Health   Financial Resource Strain:   . Difficulty of Paying Living Expenses:   Food Insecurity:   . Worried About Charity fundraiser in the Last Year:   . Arboriculturist in the Last Year:   Transportation Needs:   . Film/video editor (Medical):   Marland Kitchen Lack of Transportation (Non-Medical):   Physical Activity:   . Days of Exercise per Week:   . Minutes of Exercise per Session:   Stress:   . Feeling of Stress :   Social Connections:   . Frequency of Communication with Friends and Family:   . Frequency of Social Gatherings with Friends and Family:   . Attends Religious Services:   . Active Member of Clubs or Organizations:   . Attends  Archivist Meetings:   Marland Kitchen Marital Status:         Objective:  Physical Exam: BP 108/68 (BP Location: Right Arm, Patient Position: Sitting, Cuff Size: Normal)   Pulse 74   Temp 98.3 F (36.8 C) (Temporal)   Ht '5\' 9"'  (1.753 m)   Wt 145 lb 6.4 oz (66 kg)   LMP 04/27/2019   SpO2 98%   BMI 21.47 kg/m   Body mass index is 21.47 kg/m. Wt Readings from Last 3 Encounters:  05/09/19 145 lb 6.4 oz (66 kg)  04/05/19 145 lb (65.8 kg)  03/22/19 145 lb (65.8 kg)  Gen: NAD, resting comfortably HEENT: TMs normal bilaterally. OP clear. No thyromegaly noted.  CV: RRR with no murmurs appreciated Pulm: NWOB, CTAB with no crackles, wheezes, or rhonchi GI: Normal bowel sounds present. Soft, Nontender, Nondistended. MSK: no edema, cyanosis, or clubbing noted Skin: warm, dry Neuro: CN2-12 grossly intact. Strength 5/5 in upper and lower extremities. Reflexes symmetric and intact bilaterally.  Psych: Normal affect and thought content     Madgeline Rayo M. Jerline Pain, MD 05/09/2019 8:35 AM

## 2019-05-09 NOTE — Progress Notes (Signed)
Please inform patient of the following:  Blood work is all STABLE. Her cholesterol numbers are the same compared to last year. Do not need to start medications but she should continue working on diet and exercise and we can recheck in a year or so.  Algis Greenhouse. Jerline Pain, MD 05/09/2019 12:58 PM

## 2019-05-09 NOTE — Assessment & Plan Note (Signed)
Check lipid panel, CBC, C met, TSH.

## 2019-06-26 DIAGNOSIS — I8311 Varicose veins of right lower extremity with inflammation: Secondary | ICD-10-CM | POA: Diagnosis not present

## 2019-06-26 DIAGNOSIS — I8312 Varicose veins of left lower extremity with inflammation: Secondary | ICD-10-CM | POA: Diagnosis not present

## 2019-07-22 DIAGNOSIS — I8311 Varicose veins of right lower extremity with inflammation: Secondary | ICD-10-CM | POA: Diagnosis not present

## 2019-07-22 DIAGNOSIS — I8312 Varicose veins of left lower extremity with inflammation: Secondary | ICD-10-CM | POA: Diagnosis not present

## 2019-08-07 DIAGNOSIS — I8312 Varicose veins of left lower extremity with inflammation: Secondary | ICD-10-CM | POA: Diagnosis not present

## 2019-08-07 DIAGNOSIS — I8311 Varicose veins of right lower extremity with inflammation: Secondary | ICD-10-CM | POA: Diagnosis not present

## 2019-10-19 DIAGNOSIS — J0100 Acute maxillary sinusitis, unspecified: Secondary | ICD-10-CM | POA: Diagnosis not present

## 2019-11-11 DIAGNOSIS — L821 Other seborrheic keratosis: Secondary | ICD-10-CM | POA: Diagnosis not present

## 2019-11-11 DIAGNOSIS — D225 Melanocytic nevi of trunk: Secondary | ICD-10-CM | POA: Diagnosis not present

## 2019-11-11 DIAGNOSIS — D1801 Hemangioma of skin and subcutaneous tissue: Secondary | ICD-10-CM | POA: Diagnosis not present

## 2019-11-11 DIAGNOSIS — L812 Freckles: Secondary | ICD-10-CM | POA: Diagnosis not present

## 2019-11-11 DIAGNOSIS — D2261 Melanocytic nevi of right upper limb, including shoulder: Secondary | ICD-10-CM | POA: Diagnosis not present

## 2019-11-11 DIAGNOSIS — D2262 Melanocytic nevi of left upper limb, including shoulder: Secondary | ICD-10-CM | POA: Diagnosis not present

## 2019-12-11 DIAGNOSIS — I8312 Varicose veins of left lower extremity with inflammation: Secondary | ICD-10-CM | POA: Diagnosis not present

## 2020-01-22 ENCOUNTER — Other Ambulatory Visit: Payer: Self-pay | Admitting: Obstetrics and Gynecology

## 2020-01-22 DIAGNOSIS — N6019 Diffuse cystic mastopathy of unspecified breast: Secondary | ICD-10-CM

## 2020-01-22 DIAGNOSIS — Z6821 Body mass index (BMI) 21.0-21.9, adult: Secondary | ICD-10-CM | POA: Diagnosis not present

## 2020-01-22 DIAGNOSIS — Z01419 Encounter for gynecological examination (general) (routine) without abnormal findings: Secondary | ICD-10-CM | POA: Diagnosis not present

## 2020-01-22 DIAGNOSIS — Z1231 Encounter for screening mammogram for malignant neoplasm of breast: Secondary | ICD-10-CM | POA: Diagnosis not present

## 2020-02-03 ENCOUNTER — Ambulatory Visit: Payer: 59

## 2020-02-03 ENCOUNTER — Ambulatory Visit
Admission: RE | Admit: 2020-02-03 | Discharge: 2020-02-03 | Disposition: A | Discharge status: Home / Self Care | Payer: 59 | Source: Ambulatory Visit | Attending: Obstetrics and Gynecology | Admitting: Obstetrics and Gynecology

## 2020-02-03 ENCOUNTER — Other Ambulatory Visit: Payer: Self-pay

## 2020-02-03 DIAGNOSIS — N6019 Diffuse cystic mastopathy of unspecified breast: Secondary | ICD-10-CM

## 2020-02-26 ENCOUNTER — Other Ambulatory Visit (HOSPITAL_COMMUNITY): Payer: Self-pay | Admitting: Obstetrics and Gynecology

## 2020-03-02 ENCOUNTER — Other Ambulatory Visit: Payer: Self-pay

## 2020-03-02 ENCOUNTER — Encounter: Payer: Self-pay | Admitting: Family Medicine

## 2020-03-02 ENCOUNTER — Ambulatory Visit: Payer: 59 | Admitting: Family Medicine

## 2020-03-02 VITALS — BP 116/73 | HR 90 | Temp 98.6°F | Ht 69.0 in | Wt 138.0 lb

## 2020-03-02 DIAGNOSIS — N23 Unspecified renal colic: Secondary | ICD-10-CM

## 2020-03-02 LAB — POCT URINALYSIS DIPSTICK
Blood, UA: NEGATIVE
Glucose, UA: NEGATIVE
Nitrite, UA: POSITIVE
Protein, UA: POSITIVE — AB
Spec Grav, UA: >=1.03 — AB (ref 1.010–1.025)
Urobilinogen, UA: 2 E.U./dL — AB
pH, UA: 5 (ref 5.0–8.0)

## 2020-03-02 MED ORDER — CIPROFLOXACIN HCL 500 MG PO TABS: 500.0000 mg | ORAL_TABLET | Freq: Two times a day (BID) | ORAL | 0 refills | Status: DC

## 2020-03-02 NOTE — Patient Instructions (Signed)
It was nice to see you!  You have a urinary tract infection. Please start the antibiotic.  We will check a urine culture to make sure you do not have a resistant bacteria. We will call you if we need to change your medications.   Please make sure you are drinking plenty of fluids over the next few days.  If your symptoms do not improve over the next 5-7 days, or if they worsen, please let us know. Please also let us know if you have worsening back pain, fevers, chills, or body aches.   Take care, Dr Adonte Vanriper  

## 2020-03-02 NOTE — Progress Notes (Signed)
   Tracey Leblanc is a 50 y.o. female who presents today for an office visit.  Assessment/Plan:  UTI UA consistent with UTI. Given her mild systemic symptoms concern for possible early pyelonephritis. Appears well today with no signs of sepsis. Will start Cipro 500 mg twice daily for 5 days. Will check UA and urine culture. Encourage good oral hydration. Discussed reasons to return to care.    Subjective:  HPI:  Patient here with urinary frequency for the past few days. She called her gynecologist several days ago. She was prescribed Macrobid. This caused diarrhea, nausea, and vomiting. She stopped taking the antibiotic. She started taking Azo. Has not had any dysuria or hematuria but frequency has persisted. Also had subjective fever and chills yesterday. Feels similar to past UTIs.       Objective:  Physical Exam: There were no vitals taken for this visit.  Gen: No acute distress, resting comfortably CV: Regular rate and rhythm with no murmurs appreciated Pulm: Normal work of breathing, clear to auscultation bilaterally with no crackles, wheezes, or rhonchi Neuro: Grossly normal, moves all extremities Psych: Normal affect and thought content      Jeremaih Klima M. Jerline Pain, MD 03/02/2020 1:00 PM

## 2020-03-03 ENCOUNTER — Encounter: Payer: Self-pay | Admitting: Family Medicine

## 2020-03-03 LAB — URINE CULTURE
MICRO NUMBER:: 11448961
SPECIMEN QUALITY:: ADEQUATE

## 2020-03-04 NOTE — Progress Notes (Signed)
Please inform patient of the following:  Culture inconclusive. May be due to the macrobid she took. Recommend she finish her course of antibiotics and let us know if not improving.  Algis Greenhouse. Jerline Pain, MD 03/04/2020 10:09 AM

## 2020-03-06 ENCOUNTER — Other Ambulatory Visit: Payer: Self-pay

## 2020-03-06 ENCOUNTER — Other Ambulatory Visit: Payer: Self-pay | Admitting: *Deleted

## 2020-03-06 ENCOUNTER — Other Ambulatory Visit: Payer: 59

## 2020-03-06 DIAGNOSIS — N23 Unspecified renal colic: Secondary | ICD-10-CM

## 2020-03-06 NOTE — Telephone Encounter (Signed)
Please advise 

## 2020-03-06 NOTE — Telephone Encounter (Signed)
Got patient scheduled for Monday can we put orders in please and thank you

## 2020-03-06 NOTE — Telephone Encounter (Signed)
Patient is calling in asking for an update.  

## 2020-03-07 LAB — URINE CULTURE
MICRO NUMBER:: 11470215
Result:: NO GROWTH
SPECIMEN QUALITY:: ADEQUATE

## 2020-03-09 ENCOUNTER — Other Ambulatory Visit (INDEPENDENT_AMBULATORY_CARE_PROVIDER_SITE_OTHER): Payer: 59

## 2020-03-09 ENCOUNTER — Other Ambulatory Visit: Payer: 59

## 2020-03-09 DIAGNOSIS — N23 Unspecified renal colic: Secondary | ICD-10-CM | POA: Diagnosis not present

## 2020-03-09 LAB — COMPREHENSIVE METABOLIC PANEL
ALT: 23 U/L (ref 0–35)
AST: 17 U/L (ref 0–37)
Albumin: 4.7 g/dL (ref 3.5–5.2)
Alkaline Phosphatase: 42 U/L (ref 39–117)
BUN: 11 mg/dL (ref 6–23)
CO2: 29 mEq/L (ref 19–32)
Calcium: 9.8 mg/dL (ref 8.4–10.5)
Chloride: 104 mEq/L (ref 96–112)
Creatinine, Ser: 0.78 mg/dL (ref 0.40–1.20)
GFR: 87.8 mL/min (ref >60.00)
Glucose, Bld: 84 mg/dL (ref 70–99)
Potassium: 4 mEq/L (ref 3.5–5.1)
Sodium: 140 mEq/L (ref 135–145)
Total Bilirubin: 0.8 mg/dL (ref 0.2–1.2)
Total Protein: 7.3 g/dL (ref 6.0–8.3)

## 2020-03-09 LAB — CBC
HCT: 41.7 % (ref 36.0–46.0)
Hemoglobin: 13.9 g/dL (ref 12.0–15.0)
MCHC: 33.3 g/dL (ref 30.0–36.0)
MCV: 83.4 fl (ref 78.0–100.0)
Platelets: 372 10*3/uL (ref 150.0–400.0)
RBC: 5 Mil/uL (ref 3.87–5.11)
RDW: 14.1 % (ref 11.5–15.5)
WBC: 5.1 10*3/uL (ref 4.0–10.5)

## 2020-03-09 NOTE — Progress Notes (Signed)
Please inform patient of the following:  Blood work is NORMAL.  Tracey Leblanc. Jerline Pain, MD 03/09/2020 2:25 PM

## 2020-03-09 NOTE — Telephone Encounter (Signed)
Spoke with patient. See results note.  

## 2020-03-09 NOTE — Progress Notes (Signed)
Please inform patient of the following:  She may be having overactive bladder. We can try oxybutynin or we can refer her to see a urologist.  Tracey Leblanc. Jerline Pain, MD 03/09/2020 2:23 PM

## 2020-03-09 NOTE — Progress Notes (Signed)
Please inform patient of the following:  Her urine culture is negative. Would like for her to let us know if her symptoms have not improved.  Algis Greenhouse. Jerline Pain, MD 03/09/2020 10:45 AM

## 2020-03-13 DIAGNOSIS — L292 Pruritus vulvae: Secondary | ICD-10-CM | POA: Diagnosis not present

## 2020-03-16 ENCOUNTER — Other Ambulatory Visit: Payer: Self-pay

## 2020-03-16 DIAGNOSIS — N23 Unspecified renal colic: Secondary | ICD-10-CM

## 2020-04-22 ENCOUNTER — Ambulatory Visit: Payer: 59 | Admitting: Obstetrics and Gynecology

## 2020-05-08 ENCOUNTER — Ambulatory Visit: Payer: 59 | Admitting: Obstetrics and Gynecology

## 2020-05-08 DIAGNOSIS — I8312 Varicose veins of left lower extremity with inflammation: Secondary | ICD-10-CM | POA: Diagnosis not present

## 2020-05-11 ENCOUNTER — Encounter: Payer: 59 | Admitting: Family Medicine

## 2020-05-13 ENCOUNTER — Encounter: Payer: Self-pay | Admitting: Family Medicine

## 2020-05-13 ENCOUNTER — Other Ambulatory Visit (HOSPITAL_COMMUNITY): Payer: Self-pay

## 2020-05-14 ENCOUNTER — Other Ambulatory Visit (HOSPITAL_COMMUNITY): Payer: Self-pay

## 2020-05-14 ENCOUNTER — Other Ambulatory Visit: Payer: Self-pay

## 2020-05-14 MED ORDER — OSELTAMIVIR PHOSPHATE 75 MG PO CAPS
75.0000 mg | ORAL_CAPSULE | Freq: Two times a day (BID) | ORAL | 0 refills | Status: AC
  Filled 2020-05-14: qty 10, 5d supply, fill #0

## 2020-05-14 NOTE — Telephone Encounter (Signed)
Rx sent in

## 2020-05-14 NOTE — Telephone Encounter (Signed)
Please advise 

## 2020-05-20 ENCOUNTER — Other Ambulatory Visit (HOSPITAL_COMMUNITY): Payer: Self-pay

## 2020-06-10 ENCOUNTER — Encounter: Payer: 59 | Admitting: Family Medicine

## 2020-06-11 ENCOUNTER — Encounter: Payer: 59 | Admitting: Family Medicine

## 2020-08-05 ENCOUNTER — Encounter: Payer: Self-pay | Admitting: Family Medicine

## 2020-08-05 ENCOUNTER — Other Ambulatory Visit: Payer: Self-pay

## 2020-08-05 ENCOUNTER — Ambulatory Visit (INDEPENDENT_AMBULATORY_CARE_PROVIDER_SITE_OTHER): Payer: 59 | Admitting: Family Medicine

## 2020-08-05 VITALS — BP 95/60 | HR 63 | Temp 98.2°F | Ht 69.0 in | Wt 140.8 lb

## 2020-08-05 DIAGNOSIS — Z23 Encounter for immunization: Secondary | ICD-10-CM

## 2020-08-05 DIAGNOSIS — Z0000 Encounter for general adult medical examination without abnormal findings: Secondary | ICD-10-CM

## 2020-08-05 DIAGNOSIS — E785 Hyperlipidemia, unspecified: Secondary | ICD-10-CM

## 2020-08-05 NOTE — Assessment & Plan Note (Addendum)
Very mildly elevated LDL.  Her 10-year ASCVD remains very low.  We can recheck labs again in a year or 2.  Discussed lifestyle modifications.

## 2020-08-05 NOTE — Progress Notes (Signed)
Chief Complaint:  Tracey Leblanc is a 50 y.o. female who presents today for her annual comprehensive physical exam.    Assessment/Plan:  Chronic Problems Addressed Today: Dyslipidemia Very mildly elevated LDL.  Her 10-year ASCVD remains very low.  We can recheck labs again in a year or 2.  Discussed lifestyle modifications.  Preventative Healthcare: Tdap and shingrix given today.  Order for labs for another couple of years.  Has had mammogram and Pap done through GYN.  Up-to-date on colon cancer screening.  Patient Counseling(The following topics were reviewed and/or handout was given):  -Nutrition: Stressed importance of moderation in sodium/caffeine intake, saturated fat and cholesterol, caloric balance, sufficient intake of fresh fruits, vegetables, and fiber.  -Stressed the importance of regular exercise.   -Substance Abuse: Discussed cessation/primary prevention of tobacco, alcohol, or other drug use; driving or other dangerous activities under the influence; availability of treatment for abuse.   -Injury prevention: Discussed safety belts, safety helmets, smoke detector, smoking near bedding or upholstery.   -Sexuality: Discussed sexually transmitted diseases, partner selection, use of condoms, avoidance of unintended pregnancy and contraceptive alternatives.   -Dental health: Discussed importance of regular tooth brushing, flossing, and dental visits.  -Health maintenance and immunizations reviewed. Please refer to Health maintenance section.  Return to care in 1 year for next preventative visit.     Subjective:  HPI:  She has no acute complaints today.   Lifestyle Diet: Reasonably healthy diet, could eat more vegetables. Exercise: Uses a treadmill, walks her dog, tries to get 10,000-12,000 steps a day.  Depression screen [facility] 2/9 08/05/2020  Decreased Interest 0  Down, Depressed, Hopeless 0  PHQ - 2 Score 0  Altered sleeping -  Tired, decreased energy -  Change in  appetite -  Feeling bad or failure about yourself  -  Trouble concentrating -  Moving slowly or fidgety/restless -  Suicidal thoughts -  PHQ-9 Score -  Difficult doing work/chores -    Health Maintenance Due  Topic Date Due   Hepatitis C Screening  Never done   PAP SMEAR-Modifier  Never done   MAMMOGRAM  Never done   COVID-19 Vaccine (3 - Pfizer risk series) 04/19/2019     ROS: Per HPI, otherwise a complete review of systems was negative.   PMH:  The following were reviewed and entered/updated in epic: Past Medical History:  Diagnosis Date   Allergy    seasonal   Family history of bladder cancer    Family history of liver cancer    History of chicken pox    UTI (urinary tract infection)    Patient Active Problem List   Diagnosis Date Noted   Dyslipidemia 07/04/2017   Genetic testing 11/01/2016   Family history of liver cancer    Family history of bladder cancer    Personal history of other malignant neoplasm of skin 06/26/2013   HEMATOCHEZIA 07/22/2008   CHICKENPOX 11/01/2006   Past Surgical History:  Procedure Laterality Date   BREAST CYST ASPIRATION     COLONOSCOPY  08/08/2008   DILATION AND CURETTAGE OF UTERUS  2002   3 miscarriages    Family History  Problem Relation Age of Onset   Liver cancer Mother 68       d.61   Bladder Cancer Father 76       diagnosed with second bladder cancer at 68   COPD Father    Hearing loss Father    Heart attack Father    Heart disease  Father    Kidney disease Father    Alcohol abuse Brother    Depression Brother    Cancer Maternal Uncle        d.70s unspecified type of cancer   Hearing loss Maternal Grandmother    Heart disease Maternal Grandmother    Cancer Maternal Grandfather        d.84 unspecified type of cancer   Heart disease Paternal Grandmother    Heart attack Paternal Grandmother    Lung cancer Paternal Grandfather    Testicular cancer Paternal Grandfather        d.85   Hearing loss Paternal  Grandfather    Colon cancer Neg Hx    Colon polyps Neg Hx    Esophageal cancer Neg Hx    Rectal cancer Neg Hx    Stomach cancer Neg Hx     Medications- reviewed and updated Current Outpatient Medications  Medication Sig Dispense Refill   calcium carbonate (OS-CAL) 600 MG tablet Take 600 mg by mouth daily.     levocetirizine (XYZAL) 5 MG tablet Xyzal     No current facility-administered medications for this visit.    Allergies-reviewed and updated No Known Allergies  Social History   Socioeconomic History   Marital status: Legally Separated    Spouse name: Not on file   Number of children: Not on file   Years of education: Not on file   Highest education level: Not on file  Occupational History   Not on file  Tobacco Use   Smoking status: Never   Smokeless tobacco: Never  Vaping Use   Vaping Use: Never used  Substance and Sexual Activity   Alcohol use: Never   Drug use: Never   Sexual activity: Not on file  Other Topics Concern   Not on file  Social History Narrative   Not on file   Social Determinants of Health   Financial Resource Strain: Not on file  Food Insecurity: Not on file  Transportation Needs: Not on file  Physical Activity: Not on file  Stress: Not on file  Social Connections: Not on file        Objective:  Physical Exam: BP 95/60   Pulse 63   Temp 98.2 F (36.8 C) (Temporal)   Ht 5\' 9"  (1.753 m)   Wt 140 lb 12.8 oz (63.9 kg)   LMP 07/06/2020   SpO2 99%   BMI 20.79 kg/m   Body mass index is 20.79 kg/m. Wt Readings from Last 3 Encounters:  08/05/20 140 lb 12.8 oz (63.9 kg)  03/02/20 138 lb (62.6 kg)  05/09/19 145 lb 6.4 oz (66 kg)   Gen: NAD, resting comfortably HEENT: TMs normal bilaterally. OP clear. No thyromegaly noted.  CV: RRR with no murmurs appreciated Pulm: NWOB, CTAB with no crackles, wheezes, or rhonchi GI: Normal bowel sounds present. Soft, Nontender, Nondistended. MSK: no edema, cyanosis, or clubbing noted Skin:  warm, dry Neuro: CN2-12 grossly intact. Strength 5/5 in upper and lower extremities. Reflexes symmetric and intact bilaterally.  Psych: Normal affect and thought content     I,Jordan Kelly,acting as a scribe for Dimas Chyle, MD.,have documented all relevant documentation on the behalf of Dimas Chyle, MD,as directed by  Dimas Chyle, MD while in the presence of Dimas Chyle, MD.   I, Dimas Chyle, MD, have reviewed all documentation for this visit. The documentation on 08/05/20 for the exam, diagnosis, procedures, and orders are all accurate and complete.   Algis Greenhouse. Jerline Pain, MD 08/05/2020 9:02  AM

## 2020-08-05 NOTE — Patient Instructions (Signed)
It was very nice to see you today!  We will give you your tdap and shingrix vaccines today.   Please keep working on diet and exercise.  I will see you back in a year for your next physical. Come back sooner if needed.   Take care, Dr Jerline Pain  PLEASE NOTE:  If you had any lab tests please let us know if you have not heard back within a few days. You may see your results on mychart before we have a chance to review them but we will give you a call once they are reviewed by [place]. If we ordered any referrals today, please let us know if you have not heard from their office within the next week.   Please try these tips to maintain a healthy lifestyle:  Eat at least 3 REAL meals and 1-2 snacks per day.  Aim for no more than 5 hours between eating.  If you eat breakfast, please do so within one hour of getting up.  + Each meal should contain half fruits/vegetables, one quarter protein, and one quarter carbs (no bigger than a computer mouse)  Cut down on sweet beverages. This includes juice, soda, and sweet tea.   Drink at least 1 glass of water with each meal and aim for at least 8 glasses per day  Exercise at least 150 minutes every week.    Preventive Care 52-23 Years Old, Female Preventive care refers to lifestyle choices and visits with your health care provider that can promote health and wellness. This includes: A yearly physical exam. This is also called an annual wellness visit. Regular dental and eye exams. Immunizations. Screening for certain conditions. Healthy lifestyle choices, such as: Eating a healthy diet. Getting regular exercise. Not using drugs or products that contain nicotine and tobacco. Limiting alcohol use. What can I expect for my preventive care visit? Physical exam Your health care provider will check your: Height and weight. These may be used to calculate your BMI (body mass index). BMI is a measurement that tells if you are at a healthy weight. Heart rate  and blood pressure. Body temperature. Skin for abnormal spots. Counseling Your health care provider may ask you questions about your: Past medical problems. Family's medical history. Alcohol, tobacco, and drug use. Emotional well-being. Home life and relationship well-being. Sexual activity. Diet, exercise, and sleep habits. Work and work Statistician. Access to firearms. Method of birth control. Menstrual cycle. Pregnancy history. What immunizations do I need?  Vaccines are usually given at various ages, according to a schedule. Your health care provider will recommend vaccines for you based on your age, medicalhistory, and lifestyle or other factors, such as travel or where you work. What tests do I need? Blood tests Lipid and cholesterol levels. These may be checked every 5 years, or more often if you are over 53 years old. Hepatitis C test. Hepatitis B test. Screening Lung cancer screening. You may have this screening every year starting at age 69 if you have a 30-pack-year history of smoking and currently smoke or have quit within the past 15 years. Colorectal cancer screening. All adults should have this screening starting at age 71 and continuing until age 21. Your health care provider may recommend screening at age 76 if you are at increased risk. You will have tests every 1-10 years, depending on your results and the type of screening test. Diabetes screening. This is done by checking your blood sugar (glucose) after you have not eaten for  a while (fasting). You may have this done every 1-3 years. Mammogram. This may be done every 1-2 years. Talk with your health care provider about when you should start having regular mammograms. This may depend on whether you have a family history of breast cancer. BRCA-related cancer screening. This may be done if you have a family history of breast, ovarian, tubal, or peritoneal cancers. Pelvic exam and Pap test. This may be done  every 3 years starting at age 52. Starting at age 24, this may be done every 5 years if you have a Pap test in combination with an HPV test. Other tests STD (sexually transmitted disease) testing, if you are at risk. Bone density scan. This is done to screen for osteoporosis. You may have this scan if you are at high risk for osteoporosis. Talk with your health care provider about your test results, treatment options,and if necessary, the need for more tests. Follow these instructions at home: Eating and drinking  Eat a diet that includes fresh fruits and vegetables, whole grains, lean protein, and low-fat dairy products. Take vitamin and mineral supplements as recommended by your health care provider. Do not drink alcohol if: Your health care provider tells you not to drink. You are pregnant, may be pregnant, or are planning to become pregnant. If you drink alcohol: Limit how much you have to 0-1 drink a day. Be aware of how much alcohol is in your drink. In the [place] one drink equals one 12 oz bottle of beer (355 mL), one 5 oz glass of wine (148 mL), or one 1 oz glass of hard liquor (44 mL).  Lifestyle Take daily care of your teeth and gums. Brush your teeth every morning and night with fluoride toothpaste. Floss one time each day. Stay active. Exercise for at least 30 minutes 5 or more days each week. Do not use any products that contain nicotine or tobacco, such as cigarettes, e-cigarettes, and chewing tobacco. If you need help quitting, ask your health care provider. Do not use drugs. If you are sexually active, practice safe sex. Use a condom or other form of protection to prevent STIs (sexually transmitted infections). If you do not wish to become pregnant, use a form of birth control. If you plan to become pregnant, see your health care provider for a prepregnancy visit. If told by your health care provider, take low-dose aspirin daily starting at age 44. Find healthy ways to cope  with stress, such as: Meditation, yoga, or listening to music. Journaling. Talking to a trusted person. Spending time with friends and family. Safety Always wear your seat belt while driving or riding in a vehicle. Do not drive: If you have been drinking alcohol. Do not ride with someone who has been drinking. When you are tired or distracted. While texting. Wear a helmet and other protective equipment during sports activities. If you have firearms in your house, make sure you follow all gun safety procedures. What's next? Visit your health care provider once a year for an annual wellness visit. Ask your health care provider how often you should have your eyes and teeth checked. Stay up to date on all vaccines. This information is not intended to replace advice given to you by your health care provider. Make sure you discuss any questions you have with your healthcare provider. Document Revised: 10/29/2019 Document Reviewed: 10/05/2017 Elsevier Patient Education  2022 [employer].

## 2020-11-03 ENCOUNTER — Ambulatory Visit: Payer: 59

## 2020-12-01 ENCOUNTER — Other Ambulatory Visit: Payer: Self-pay

## 2020-12-01 ENCOUNTER — Ambulatory Visit (INDEPENDENT_AMBULATORY_CARE_PROVIDER_SITE_OTHER): Payer: 59

## 2020-12-01 DIAGNOSIS — Z23 Encounter for immunization: Secondary | ICD-10-CM

## 2021-02-26 DIAGNOSIS — Z01419 Encounter for gynecological examination (general) (routine) without abnormal findings: Secondary | ICD-10-CM | POA: Diagnosis not present

## 2021-02-26 DIAGNOSIS — Z681 Body mass index (BMI) 19 or less, adult: Secondary | ICD-10-CM | POA: Diagnosis not present

## 2021-02-26 DIAGNOSIS — Z1231 Encounter for screening mammogram for malignant neoplasm of breast: Secondary | ICD-10-CM | POA: Diagnosis not present

## 2021-03-29 ENCOUNTER — Ambulatory Visit: Payer: 59 | Admitting: Family Medicine

## 2021-03-29 ENCOUNTER — Encounter: Payer: Self-pay | Admitting: Family Medicine

## 2021-03-29 ENCOUNTER — Other Ambulatory Visit: Payer: Self-pay

## 2021-03-29 VITALS — BP 122/58 | HR 72 | Temp 98.7°F | Ht 69.0 in | Wt 136.8 lb

## 2021-03-29 DIAGNOSIS — J329 Chronic sinusitis, unspecified: Secondary | ICD-10-CM

## 2021-03-29 MED ORDER — AZELASTINE HCL 0.1 % NA SOLN: 2.0000 | Freq: Two times a day (BID) | NASAL | 12 refills | Status: DC

## 2021-03-29 MED ORDER — AZITHROMYCIN 250 MG PO TABS: ORAL_TABLET | ORAL | 0 refills | Status: DC

## 2021-03-29 NOTE — Progress Notes (Signed)
° °  Tracey Leblanc is a 51 y.o. female who presents today for an office visit.  Assessment/Plan:  Sinusitis No red flags.  Given length of symptoms we will start antibiotics..  Also start Astelin.  We discussed starting Augmentin however she would like to defer for now and would like to try a azithromycin first.  This is reasonable.  Encouraged hydration.  She can continue over-the-counter meds.  We discussed reasons to return to care.  Follow-up as needed.    Subjective:  HPI:  Patient here with sinus congestion.  Started 2 to 3 weeks ago.  She has developed worsening headache for the last couple of days.Located in forehead. Tried taking OTC meds with some improvement.        Objective:  Physical Exam: BP (!) 122/58    Pulse 72    Temp 98.7 F (37.1 C) (Temporal)    Ht 5\' 9"  (1.753 m)    Wt 136 lb 12.8 oz (62.1 kg)    SpO2 98%    BMI 20.20 kg/m   Gen: No acute distress, resting comfortably HEENT: TMs with clear effusion right worse than left.  Nasal mucosa erythematous and boggy.  Decreased transillumination and maxillary sinuses bilaterally.  OP erythematous.  No exudate.  No lymphadenopathy. CV: Regular rate and rhythm with no murmurs appreciated Pulm: Normal work of breathing, clear to auscultation bilaterally with no crackles, wheezes, or rhonchi Neuro: Grossly normal, moves all extremities Psych: Normal affect and thought content      Tracey Leblanc M. Jerline Pain, MD 03/29/2021 4:06 PM

## 2021-03-29 NOTE — Patient Instructions (Signed)
It was very nice to see you today!  You have a sinus infection.  Please start the nasal spray and antibiotic.  Let me know if not improving in the next few days.  Take care, Dr Jerline Pain  PLEASE NOTE:  If you had any lab tests please let us know if you have not heard back within a few days. You may see your results on mychart before we have a chance to review them but we will give you a call once they are reviewed by [place]. If we ordered any referrals today, please let us know if you have not heard from their office within the next week.   Please try these tips to maintain a healthy lifestyle:  Eat at least 3 REAL meals and 1-2 snacks per day.  Aim for no more than 5 hours between eating.  If you eat breakfast, please do so within one hour of getting up.   Each meal should contain half fruits/vegetables, one quarter protein, and one quarter carbs (no bigger than a computer mouse)  Cut down on sweet beverages. This includes juice, soda, and sweet tea.   Drink at least 1 glass of water with each meal and aim for at least 8 glasses per day  Exercise at least 150 minutes every week.

## 2021-05-27 DIAGNOSIS — D1801 Hemangioma of skin and subcutaneous tissue: Secondary | ICD-10-CM | POA: Diagnosis not present

## 2021-05-27 DIAGNOSIS — D2272 Melanocytic nevi of left lower limb, including hip: Secondary | ICD-10-CM | POA: Diagnosis not present

## 2021-05-27 DIAGNOSIS — D2262 Melanocytic nevi of left upper limb, including shoulder: Secondary | ICD-10-CM | POA: Diagnosis not present

## 2021-05-27 DIAGNOSIS — D2261 Melanocytic nevi of right upper limb, including shoulder: Secondary | ICD-10-CM | POA: Diagnosis not present

## 2021-05-27 DIAGNOSIS — D225 Melanocytic nevi of trunk: Secondary | ICD-10-CM | POA: Diagnosis not present

## 2021-05-27 DIAGNOSIS — L438 Other lichen planus: Secondary | ICD-10-CM | POA: Diagnosis not present

## 2021-05-27 DIAGNOSIS — L821 Other seborrheic keratosis: Secondary | ICD-10-CM | POA: Diagnosis not present

## 2021-05-27 DIAGNOSIS — L720 Epidermal cyst: Secondary | ICD-10-CM | POA: Diagnosis not present

## 2021-05-27 DIAGNOSIS — L812 Freckles: Secondary | ICD-10-CM | POA: Diagnosis not present

## 2021-06-25 DIAGNOSIS — E559 Vitamin D deficiency, unspecified: Secondary | ICD-10-CM | POA: Diagnosis not present

## 2021-09-09 ENCOUNTER — Encounter: Payer: Self-pay | Admitting: Family Medicine

## 2021-09-09 ENCOUNTER — Ambulatory Visit (INDEPENDENT_AMBULATORY_CARE_PROVIDER_SITE_OTHER): Payer: 59 | Admitting: Family Medicine

## 2021-09-09 VITALS — BP 95/65 | HR 71 | Temp 98.4°F | Ht 69.0 in | Wt 143.8 lb

## 2021-09-09 DIAGNOSIS — E785 Hyperlipidemia, unspecified: Secondary | ICD-10-CM | POA: Diagnosis not present

## 2021-09-09 DIAGNOSIS — Z0001 Encounter for general adult medical examination with abnormal findings: Secondary | ICD-10-CM | POA: Diagnosis not present

## 2021-09-09 DIAGNOSIS — R739 Hyperglycemia, unspecified: Secondary | ICD-10-CM

## 2021-09-09 LAB — HEMOGLOBIN A1C: Hgb A1c MFr Bld: 5.6 % (ref 4.6–6.5)

## 2021-09-09 LAB — LIPID PANEL
Cholesterol: 202 mg/dL — ABNORMAL HIGH (ref 0–200)
HDL: 60.9 mg/dL (ref >39.00)
LDL Cholesterol: 127 mg/dL — ABNORMAL HIGH (ref 0–99)
NonHDL: 140.88
Total CHOL/HDL Ratio: 3
Triglycerides: 68 mg/dL (ref 0.0–149.0)
VLDL: 13.6 mg/dL (ref 0.0–40.0)

## 2021-09-09 NOTE — Assessment & Plan Note (Signed)
Continue lifestyle modifications.  We will check lipids.

## 2021-09-09 NOTE — Progress Notes (Signed)
Chief Complaint:  Tracey Leblanc is a 51 y.o. female who presents today for her annual comprehensive physical exam.    Assessment/Plan:  Chronic Problems Addressed Today: Dyslipidemia Continue lifestyle modifications.  We will check lipids.  Preventative Healthcare: UTD on vaccines and screenings. Will check lipids and A1c to screen for dyslipidemia and diabetes.  Patient Counseling(The following topics were reviewed and/or handout was given):  -Nutrition: Stressed importance of moderation in sodium/caffeine intake, saturated fat and cholesterol, caloric balance, sufficient intake of fresh fruits, vegetables, and fiber.  -Stressed the importance of regular exercise.   -Substance Abuse: Discussed cessation/primary prevention of tobacco, alcohol, or other drug use; driving or other dangerous activities under the influence; availability of treatment for abuse.   -Injury prevention: Discussed safety belts, safety helmets, smoke detector, smoking near bedding or upholstery.   -Sexuality: Discussed sexually transmitted diseases, partner selection, use of condoms, avoidance of unintended pregnancy and contraceptive alternatives.   -Dental health: Discussed importance of regular tooth brushing, flossing, and dental visits.  -Health maintenance and immunizations reviewed. Please refer to Health maintenance section.  Return to care in 1 year for next preventative visit.     Subjective:  HPI:  She has no acute complaints today.   Lifestyle Diet: Balanced. Tries to get plenty of fruits and vegetables.  Exercise: Likes to walk daily. Will be planning on going to the gym more often.      09/09/2021    8:26 AM  Depression screen PHQ 2/9  Decreased Interest 0  Down, Depressed, Hopeless 0  PHQ - 2 Score 0    There are no preventive care reminders to display for this patient.    ROS: Per HPI, otherwise a complete review of systems was negative.   PMH:  The following were reviewed  and entered/updated in epic: Past Medical History:  Diagnosis Date   Allergy    seasonal   Family history of bladder cancer    Family history of liver cancer    History of chicken pox    UTI (urinary tract infection)    Patient Active Problem List   Diagnosis Date Noted   Dyslipidemia 07/04/2017   Genetic testing 11/01/2016   Family history of liver cancer    Family history of bladder cancer    Personal history of other malignant neoplasm of skin 06/26/2013   HEMATOCHEZIA 07/22/2008   CHICKENPOX 11/01/2006   Past Surgical History:  Procedure Laterality Date   BREAST CYST ASPIRATION     COLONOSCOPY  08/08/2008   DILATION AND CURETTAGE OF UTERUS  2002   3 miscarriages    Family History  Problem Relation Age of Onset   Liver cancer Mother 58       d.61   Bladder Cancer Father 99       diagnosed with second bladder cancer at 46   COPD Father    Hearing loss Father    Heart attack Father    Heart disease Father    Kidney disease Father    Alcohol abuse Brother    Depression Brother    Cancer Maternal Uncle        d.70s unspecified type of cancer   Hearing loss Maternal Grandmother    Heart disease Maternal Grandmother    Cancer Maternal Grandfather        d.84 unspecified type of cancer   Heart disease Paternal Grandmother    Heart attack Paternal Grandmother    Lung cancer Paternal Grandfather    Testicular  cancer Paternal Grandfather        d.85   Hearing loss Paternal Grandfather    Colon cancer Neg Hx    Colon polyps Neg Hx    Esophageal cancer Neg Hx    Rectal cancer Neg Hx    Stomach cancer Neg Hx     Medications- reviewed and updated Current Outpatient Medications  Medication Sig Dispense Refill   Vitamin D, Ergocalciferol, (DRISDOL) 1.25 MG (50000 UNIT) CAPS capsule Take 50,000 Units by mouth every 7 (seven) days.     No current facility-administered medications for this visit.    Allergies-reviewed and updated No Known Allergies  Social  History   Socioeconomic History   Marital status: Divorced    Spouse name: Not on file   Number of children: Not on file   Years of education: Not on file   Highest education level: Not on file  Occupational History   Not on file  Tobacco Use   Smoking status: Never   Smokeless tobacco: Never  Vaping Use   Vaping Use: Never used  Substance and Sexual Activity   Alcohol use: Never   Drug use: Never   Sexual activity: Not on file  Other Topics Concern   Not on file  Social History Narrative   Not on file   Social Determinants of Health   Financial Resource Strain: Not on file  Food Insecurity: Not on file  Transportation Needs: Not on file  Physical Activity: Not on file  Stress: Not on file  Social Connections: Not on file        Objective:  Physical Exam: BP 95/65   Pulse 71   Temp 98.4 F (36.9 C) (Temporal)   Ht '5\' 9"'$  (1.753 m)   Wt 143 lb 12.8 oz (65.2 kg)   LMP 09/05/2021   BMI 21.24 kg/m   Body mass index is 21.24 kg/m. Wt Readings from Last 3 Encounters:  09/09/21 143 lb 12.8 oz (65.2 kg)  03/29/21 136 lb 12.8 oz (62.1 kg)  08/05/20 140 lb 12.8 oz (63.9 kg)   Gen: NAD, resting comfortably HEENT: TMs normal bilaterally. OP clear. No thyromegaly noted.  CV: RRR with no murmurs appreciated Pulm: NWOB, CTAB with no crackles, wheezes, or rhonchi GI: Normal bowel sounds present. Soft, Nontender, Nondistended. MSK: no edema, cyanosis, or clubbing noted Skin: warm, dry Neuro: CN2-12 grossly intact. Strength 5/5 in upper and lower extremities. Reflexes symmetric and intact bilaterally.  Psych: Normal affect and thought content     Wane Mollett M. Jerline Pain, MD 09/09/2021 8:51 AM

## 2021-09-09 NOTE — Patient Instructions (Signed)
It was very nice to see you today!  Please come back to have your blood work done.  Keep up the great work.  Continue to work on diet and exercise.  I will see back in a year for your next physical.  Come back sooner if needed.  Take care, Dr Jerline Pain  PLEASE NOTE:  If you had any lab tests please let us know if you have not heard back within a few days. You may see your results on mychart before we have a chance to review them but we will give you a call once they are reviewed by [place]. If we ordered any referrals today, please let us know if you have not heard from their office within the next week.   Please try these tips to maintain a healthy lifestyle:  Eat at least 3 REAL meals and 1-2 snacks per day.  Aim for no more than 5 hours between eating.  If you eat breakfast, please do so within one hour of getting up.   Each meal should contain half fruits/vegetables, one quarter protein, and one quarter carbs (no bigger than a computer mouse)  Cut down on sweet beverages. This includes juice, soda, and sweet tea.   Drink at least 1 glass of water with each meal and aim for at least 8 glasses per day  Exercise at least 150 minutes every week.    Preventive Care 22-76 Years Old, Female Preventive care refers to lifestyle choices and visits with your health care provider that can promote health and wellness. Preventive care visits are also called wellness exams. What can I expect for my preventive care visit? Counseling Your health care provider may ask you questions about your: Medical history, including: Past medical problems. Family medical history. Pregnancy history. Current health, including: Menstrual cycle. Method of birth control. Emotional well-being. Home life and relationship well-being. Sexual activity and sexual health. Lifestyle, including: Alcohol, nicotine or tobacco, and drug use. Access to firearms. Diet, exercise, and sleep habits. Work and work  Statistician. Sunscreen use. Safety issues such as seatbelt and bike helmet use. Physical exam Your health care provider will check your: Height and weight. These may be used to calculate your BMI (body mass index). BMI is a measurement that tells if you are at a healthy weight. Waist circumference. This measures the distance around your waistline. This measurement also tells if you are at a healthy weight and may help predict your risk of certain diseases, such as type 2 diabetes and high blood pressure. Heart rate and blood pressure. Body temperature. Skin for abnormal spots. What immunizations do I need?  Vaccines are usually given at various ages, according to a schedule. Your health care provider will recommend vaccines for you based on your age, medical history, and lifestyle or other factors, such as travel or where you work. What tests do I need? Screening Your health care provider may recommend screening tests for certain conditions. This may include: Lipid and cholesterol levels. Diabetes screening. This is done by checking your blood sugar (glucose) after you have not eaten for a while (fasting). Pelvic exam and Pap test. Hepatitis B test. Hepatitis C test. HIV (human immunodeficiency virus) test. STI (sexually transmitted infection) testing, if you are at risk. Lung cancer screening. Colorectal cancer screening. Mammogram. Talk with your health care provider about when you should start having regular mammograms. This may depend on whether you have a family history of breast cancer. BRCA-related cancer screening. This may be done  if you have a family history of breast, ovarian, tubal, or peritoneal cancers. Bone density scan. This is done to screen for osteoporosis. Talk with your health care provider about your test results, treatment options, and if necessary, the need for more tests. Follow these instructions at home: Eating and drinking  Eat a diet that includes fresh  fruits and vegetables, whole grains, lean protein, and low-fat dairy products. Take vitamin and mineral supplements as recommended by your health care provider. Do not drink alcohol if: Your health care provider tells you not to drink. You are pregnant, may be pregnant, or are planning to become pregnant. If you drink alcohol: Limit how much you have to 0-1 drink a day. Know how much alcohol is in your drink. In the [place] one drink equals one 12 oz bottle of beer (355 mL), one 5 oz glass of wine (148 mL), or one 1 oz glass of hard liquor (44 mL). Lifestyle Brush your teeth every morning and night with fluoride toothpaste. Floss one time each day. Exercise for at least 30 minutes 5 or more days each week. Do not use any products that contain nicotine or tobacco. These products include cigarettes, chewing tobacco, and vaping devices, such as e-cigarettes. If you need help quitting, ask your health care provider. Do not use drugs. If you are sexually active, practice safe sex. Use a condom or other form of protection to prevent STIs. If you do not wish to become pregnant, use a form of birth control. If you plan to become pregnant, see your health care provider for a prepregnancy visit. Take aspirin only as told by your health care provider. Make sure that you understand how much to take and what form to take. Work with your health care provider to find out whether it is safe and beneficial for you to take aspirin daily. Find healthy ways to manage stress, such as: Meditation, yoga, or listening to music. Journaling. Talking to a trusted person. Spending time with friends and family. Minimize exposure to UV radiation to reduce your risk of skin cancer. Safety Always wear your seat belt while driving or riding in a vehicle. Do not drive: If you have been drinking alcohol. Do not ride with someone who has been drinking. When you are tired or distracted. While texting. If you have been using  any mind-altering substances or drugs. Wear a helmet and other protective equipment during sports activities. If you have firearms in your house, make sure you follow all gun safety procedures. Seek help if you have been physically or sexually abused. What's next? Visit your health care provider once a year for an annual wellness visit. Ask your health care provider how often you should have your eyes and teeth checked. Stay up to date on all vaccines. This information is not intended to replace advice given to you by your health care provider. Make sure you discuss any questions you have with your health care provider. Document Revised: 07/22/2020 Document Reviewed: 07/22/2020 Elsevier Patient Education  [place].

## 2021-09-10 DIAGNOSIS — I872 Venous insufficiency (chronic) (peripheral): Secondary | ICD-10-CM | POA: Diagnosis not present

## 2021-09-10 DIAGNOSIS — M79604 Pain in right leg: Secondary | ICD-10-CM | POA: Diagnosis not present

## 2021-09-10 DIAGNOSIS — I83893 Varicose veins of bilateral lower extremities with other complications: Secondary | ICD-10-CM | POA: Diagnosis not present

## 2021-09-10 DIAGNOSIS — M79605 Pain in left leg: Secondary | ICD-10-CM | POA: Diagnosis not present

## 2021-09-10 NOTE — Progress Notes (Signed)
Please inform patient of the following:  Cholesterol is a little bit.  Do not need to start meds.  She should continue to work on diet and exercise and we can recheck in a year.

## 2021-09-28 DIAGNOSIS — H524 Presbyopia: Secondary | ICD-10-CM | POA: Diagnosis not present

## 2021-12-01 ENCOUNTER — Telehealth: Payer: Self-pay | Admitting: Family Medicine

## 2021-12-01 ENCOUNTER — Ambulatory Visit: Payer: 59 | Admitting: Family

## 2021-12-01 VITALS — BP 124/68 | HR 113 | Temp 98.2°F | Ht 69.0 in | Wt 138.2 lb

## 2021-12-01 DIAGNOSIS — U071 COVID-19: Secondary | ICD-10-CM | POA: Diagnosis not present

## 2021-12-01 DIAGNOSIS — J029 Acute pharyngitis, unspecified: Secondary | ICD-10-CM | POA: Diagnosis not present

## 2021-12-01 LAB — POC COVID19 BINAXNOW: SARS Coronavirus 2 Ag: POSITIVE — AB

## 2021-12-01 LAB — POCT RAPID STREP A (OFFICE): Rapid Strep A Screen: NEGATIVE

## 2021-12-01 NOTE — Telephone Encounter (Signed)
Patient advise to stopped by the office to sign a record release form to be able to email to employer Verbalized understanding

## 2021-12-01 NOTE — Progress Notes (Signed)
Patient ID: Tracey Leblanc, female    DOB: 03/28/1970, 51 y.o.   MRN: [id number]  Chief Complaint  Patient presents with   Sore Throat    Started with congestion this am. Sore throat, coughing.     HPI:      URI sx:  had some sinus sx beginning of the month, felt  a little better, then sx returned yesterday with nasal congestion, sore throat, and dry cough. Pt denies fever, aches, or diarrhea/nausea.      Assessment & Plan:  1. Sore throat rapid strep neg, covid positive.  - POC COVID-19 - POCT rapid strep A  2. COVID-19 discussed antiviral benefits, possible SE, pt refusing tx, advised on CDC guidelines for masking and return to work precautions. Ibuprofen '600mg'$  3 times per day for sore throat pain, swelling, and/or fever. Gargle with warm salt water several times per day. OK to use generic Sudafed tid prn for any sinus sx, postnasal drip, OTC cough syrup. Drink plenty of water, up to 2liters qd.  - POC COVID-19    Subjective:    Outpatient Medications Prior to Visit  Medication Sig Dispense Refill   Vitamin D, Ergocalciferol, (DRISDOL) 1.25 MG (50000 UNIT) CAPS capsule Take 50,000 Units by mouth every 7 (seven) days.     No facility-administered medications prior to visit.   Past Medical History:  Diagnosis Date   Allergy    seasonal   Family history of bladder cancer    Family history of liver cancer    History of chicken pox    UTI (urinary tract infection)    Past Surgical History:  Procedure Laterality Date   BREAST CYST ASPIRATION     COLONOSCOPY  08/08/2008   DILATION AND CURETTAGE OF UTERUS  2002   3 miscarriages   No Known Allergies    Objective:    Physical Exam Vitals and nursing note reviewed.  Constitutional:      Appearance: Normal appearance. She is not ill-appearing.     Interventions: Face mask in place.  HENT:     Right Ear: Tympanic membrane and ear canal normal.     Left Ear: Tympanic membrane and ear canal normal.     Nose:      Right Sinus: No frontal sinus tenderness.     Left Sinus: No frontal sinus tenderness.     Mouth/Throat:     Mouth: Mucous membranes are moist.     Pharynx: Posterior oropharyngeal erythema present. No pharyngeal swelling, oropharyngeal exudate or uvula swelling.     Tonsils: No tonsillar exudate or tonsillar abscesses.  Cardiovascular:     Rate and Rhythm: Normal rate and regular rhythm.  Pulmonary:     Effort: Pulmonary effort is normal.     Breath sounds: Normal breath sounds.  Musculoskeletal:        General: Normal range of motion.  Lymphadenopathy:     Head:     Right side of head: No preauricular or posterior auricular adenopathy.     Left side of head: No preauricular or posterior auricular adenopathy.     Cervical: No cervical adenopathy.  Skin:    General: Skin is warm and dry.  Neurological:     Mental Status: She is alert.  Psychiatric:        Mood and Affect: Mood normal.        Behavior: Behavior normal.    BP 124/68   Pulse (!) 113   Temp 98.2 F (36.8 C)  Ht '5\' 9"'$  (1.753 m)   Wt 138 lb 3.2 oz (62.7 kg)   SpO2 98%   BMI 20.41 kg/m  Wt Readings from Last 3 Encounters:  12/01/21 138 lb 3.2 oz (62.7 kg)  09/09/21 143 lb 12.8 oz (65.2 kg)  03/29/21 136 lb 12.8 oz (62.1 kg)       Jeanie Sewer, NP

## 2021-12-01 NOTE — Telephone Encounter (Signed)
Pt states: -Employer needs covid-19 test results sent to them, performed today 12/01/21   Pt requests: -send email with test results: Subject line and email address provided.  Email Address: [email or web address]   Email Subject lineJalani Cullifer [phone] 12/01/21 (Secure)

## 2021-12-01 NOTE — Patient Instructions (Addendum)
It was very nice to see you today!   Per latest CDC guidelines, it is recommended you quarantine at home for 5 days from the 1st day of sx, then if you choose to retest at home for covid and negative, you can return to work without a mask, if positive, you should wear a mask at work for an additional 5 days. If you have fever after 5 days, you should stay home until fever free.  Drink plenty of fluids, ok to take  Ibuprofen '600mg'$  3 times per day for sore throat pain, swelling, and/or fever. Gargle with warm salt water several times per day. OK to use generic Sudafed tid prn for any sinus sx, postnasal drip, and OTC cough syrup.  Hope you feel better soon!   PLEASE NOTE:  If you had any lab tests please let us know if you have not heard back within a few days. You may see your results on MyChart before we have a chance to review them but we will give you a call once they are reviewed by [place]. If we ordered any referrals today, please let us know if you have not heard from their office within the next week.

## 2021-12-07 ENCOUNTER — Encounter: Payer: Self-pay | Admitting: Family

## 2021-12-07 DIAGNOSIS — R053 Chronic cough: Secondary | ICD-10-CM

## 2021-12-10 ENCOUNTER — Other Ambulatory Visit (HOSPITAL_COMMUNITY): Payer: Self-pay

## 2021-12-10 MED ORDER — BENZONATATE 200 MG PO CAPS
200.0000 mg | ORAL_CAPSULE | Freq: Three times a day (TID) | ORAL | 0 refills | Status: AC | PRN
  Filled 2021-12-10: qty 30, 10d supply, fill #0

## 2022-04-02 ENCOUNTER — Telehealth: Payer: Commercial Managed Care - PPO | Admitting: Nurse Practitioner

## 2022-04-02 DIAGNOSIS — R399 Unspecified symptoms and signs involving the genitourinary system: Secondary | ICD-10-CM | POA: Diagnosis not present

## 2022-04-02 MED ORDER — CEPHALEXIN 500 MG PO CAPS: 500.0000 mg | ORAL_CAPSULE | Freq: Two times a day (BID) | ORAL | 0 refills | Status: DC

## 2022-04-02 NOTE — Progress Notes (Signed)
E-Visit for Urinary Problems  We are sorry that you are not feeling well.  Here is how we plan to help!  Based on what you shared with me it looks like you most likely have a simple urinary tract infection.  A UTI (Urinary Tract Infection) is a bacterial infection of the bladder.  Most cases of urinary tract infections are simple to treat but a key part of your care is to encourage you to drink plenty of fluids and watch your symptoms carefully.  I have prescribed Keflex 500 mg twice a day for 7 days.  Your symptoms should gradually improve. Call us if the burning in your urine worsens, you develop worsening fever, back pain or pelvic pain or if your symptoms do not resolve after completing the antibiotic.  Urinary tract infections can be prevented by drinking plenty of water to keep your body hydrated.  Also be sure when you wipe, wipe from front to back and don't hold it in!  If possible, empty your bladder every 4 hours.  HOME CARE Drink plenty of fluids Compete the full course of the antibiotics even if the symptoms resolve Remember, when you need to go.go. Holding in your urine can increase the likelihood of getting a UTI! GET HELP RIGHT AWAY IF: You cannot urinate You get a high fever Worsening back pain occurs You see blood in your urine You feel sick to your stomach or throw up You feel like you are going to pass out  MAKE SURE YOU  Understand these instructions. Will watch your condition. Will get help right away if you are not doing well or get worse.   Thank you for choosing an e-visit.  Your e-visit answers were reviewed by a board certified advanced clinical practitioner to complete your personal care plan. Depending upon the condition, your plan could have included both over the counter or prescription medications.  Please review your pharmacy choice. Make sure the pharmacy is open so you can pick up prescription now. If there is a problem, you may contact your  provider through [employer] and have the prescription routed to another pharmacy.  Your safety is important to [place]. If you have drug allergies check your prescription carefully.   For the next 24 hours you can use MyChart to ask questions about today's visit, request a non-urgent call back, or ask for a work or school excuse. You will get an email in the next two days asking about your experience. I hope that your e-visit has been valuable and will speed your recovery.  Tracey Hassell Done, FNP   5-10 minutes spent reviewing and documenting in chart.

## 2022-04-04 ENCOUNTER — Other Ambulatory Visit (HOSPITAL_COMMUNITY): Payer: Self-pay

## 2022-04-04 DIAGNOSIS — R350 Frequency of micturition: Secondary | ICD-10-CM | POA: Diagnosis not present

## 2022-04-04 MED ORDER — NITROFURANTOIN MONOHYD MACRO 100 MG PO CAPS
100.0000 mg | ORAL_CAPSULE | Freq: Two times a day (BID) | ORAL | 0 refills | Status: DC
  Filled 2022-04-04: qty 14, 7d supply, fill #0

## 2022-04-14 DIAGNOSIS — K649 Unspecified hemorrhoids: Secondary | ICD-10-CM | POA: Diagnosis not present

## 2022-04-14 DIAGNOSIS — R350 Frequency of micturition: Secondary | ICD-10-CM | POA: Diagnosis not present

## 2022-06-27 DIAGNOSIS — L814 Other melanin hyperpigmentation: Secondary | ICD-10-CM | POA: Diagnosis not present

## 2022-06-27 DIAGNOSIS — D225 Melanocytic nevi of trunk: Secondary | ICD-10-CM | POA: Diagnosis not present

## 2022-06-27 DIAGNOSIS — L821 Other seborrheic keratosis: Secondary | ICD-10-CM | POA: Diagnosis not present

## 2022-06-27 DIAGNOSIS — D2271 Melanocytic nevi of right lower limb, including hip: Secondary | ICD-10-CM | POA: Diagnosis not present

## 2022-06-27 DIAGNOSIS — D2272 Melanocytic nevi of left lower limb, including hip: Secondary | ICD-10-CM | POA: Diagnosis not present

## 2022-06-27 DIAGNOSIS — D1801 Hemangioma of skin and subcutaneous tissue: Secondary | ICD-10-CM | POA: Diagnosis not present

## 2022-07-13 DIAGNOSIS — H43811 Vitreous degeneration, right eye: Secondary | ICD-10-CM | POA: Diagnosis not present

## 2022-07-20 DIAGNOSIS — D3131 Benign neoplasm of right choroid: Secondary | ICD-10-CM | POA: Diagnosis not present

## 2022-07-20 DIAGNOSIS — D3132 Benign neoplasm of left choroid: Secondary | ICD-10-CM | POA: Diagnosis not present

## 2022-07-20 DIAGNOSIS — H43811 Vitreous degeneration, right eye: Secondary | ICD-10-CM | POA: Diagnosis not present

## 2022-08-19 DIAGNOSIS — F332 Major depressive disorder, recurrent severe without psychotic features: Secondary | ICD-10-CM | POA: Diagnosis not present

## 2022-08-24 DIAGNOSIS — H43811 Vitreous degeneration, right eye: Secondary | ICD-10-CM | POA: Diagnosis not present

## 2022-09-27 DIAGNOSIS — Z6820 Body mass index (BMI) 20.0-20.9, adult: Secondary | ICD-10-CM | POA: Diagnosis not present

## 2022-09-27 DIAGNOSIS — R61 Generalized hyperhidrosis: Secondary | ICD-10-CM | POA: Diagnosis not present

## 2022-09-27 DIAGNOSIS — Z1231 Encounter for screening mammogram for malignant neoplasm of breast: Secondary | ICD-10-CM | POA: Diagnosis not present

## 2022-09-27 DIAGNOSIS — Z01419 Encounter for gynecological examination (general) (routine) without abnormal findings: Secondary | ICD-10-CM | POA: Diagnosis not present

## 2022-11-08 ENCOUNTER — Encounter: Payer: Commercial Managed Care - PPO | Admitting: Family Medicine

## 2023-03-07 ENCOUNTER — Ambulatory Visit: Payer: Commercial Managed Care - PPO | Admitting: Physician Assistant

## 2023-03-07 ENCOUNTER — Other Ambulatory Visit (HOSPITAL_COMMUNITY): Payer: Self-pay

## 2023-03-07 VITALS — BP 106/74 | HR 91 | Temp 98.8°F | Ht 69.0 in | Wt 141.6 lb

## 2023-03-07 DIAGNOSIS — J069 Acute upper respiratory infection, unspecified: Secondary | ICD-10-CM | POA: Diagnosis not present

## 2023-03-07 DIAGNOSIS — R051 Acute cough: Secondary | ICD-10-CM | POA: Diagnosis not present

## 2023-03-07 LAB — POCT INFLUENZA A/B
Influenza A, POC: NEGATIVE
Influenza B, POC: NEGATIVE

## 2023-03-07 MED ORDER — GUAIFENESIN-CODEINE 100-10 MG/5ML PO SOLN
5.0000 mL | Freq: Every evening | ORAL | 0 refills | Status: DC | PRN
  Filled 2023-03-07: qty 118, 23d supply, fill #0

## 2023-03-07 MED ORDER — PSEUDOEPHEDRINE-CODEINE-GG 30-10-100 MG/5ML PO SOLN
10.0000 mL | Freq: Every evening | ORAL | 0 refills | Status: DC | PRN
  Filled 2023-03-07: qty 118, 11d supply, fill #0

## 2023-03-07 NOTE — Progress Notes (Signed)
Patient ID: Tracey Leblanc, female    DOB: 1970-05-05, 53 y.o.   MRN: [id number]   Assessment & Plan:  Acute cough -     POCT Influenza A/B  Acute URI  Other orders -     guaiFENesin-Codeine; Take 5 mLs by mouth at bedtime as needed for cough.  Dispense: 118 mL; Refill: 0    Assessment and Plan    Upper Respiratory Infection Symptoms started on Friday with sore throat, cough, and fatigue. Cough is dry during the day and wet at night. Sore throat comes and goes. No fever. Flu test negative. COVID test done on Sunday, results pending. Lungs sound clear on examination. -Continue rest, hydration, and over-the-counter remedies. -Prescribe Tussionex for cough to aid with sleep. Pt aware of risks vs benefits and possible adverse reactions. Don't drive with this medication.  -If symptoms persist or worsen, consider re-evaluation for possible bacterial infection or pertussis.  General Health Maintenance -Schedule annual physical to avoid increase in insurance premiums.        Subjective:    Chief Complaint  Patient presents with   Cough    Pt in office c/o frequent cough and Covid test was negative; pt has flu like symptoms, coworker had flu as well; symptoms started Friday, mild fever on and off; non productive cough during the day and at night when laying down gets croupy, ribs sore from cough    Cough     History of Present Illness   The patient, with no significant past medical history, presents with a four-day history of upper respiratory symptoms that started on Friday afternoon. The symptoms began with a twinge in the nose and a sore throat, which progressed to coughing. The coughing has been so severe that it has caused soreness in the ribs. The cough is described as dry during the day and wet at night. The sore throat has been intermittent, coming and going over the past few days. The patient also reports a lack of energy and difficulty sleeping due to the coughing.  The patient has noticed some shortness of breath since yesterday. The patient has been tested for flu and COVID-19. The flu test was negative and the results of the COVID-19 test are pending. The patient has been taking Nyquil to manage the symptoms.  Coworker recently sick with Flu.        Past Medical History:  Diagnosis Date   Allergy    seasonal   Family history of bladder cancer    Family history of liver cancer    History of chicken pox    UTI (urinary tract infection)     Past Surgical History:  Procedure Laterality Date   BREAST CYST ASPIRATION     COLONOSCOPY  08/08/2008   DILATION AND CURETTAGE OF UTERUS  2002   3 miscarriages    Family History  Problem Relation Age of Onset   Liver cancer Mother 79       d.61   Bladder Cancer Father 14       diagnosed with second bladder cancer at 34   COPD Father    Hearing loss Father    Heart attack Father    Heart disease Father    Kidney disease Father    Alcohol abuse Brother    Depression Brother    Cancer Maternal Uncle        d.70s unspecified type of cancer   Hearing loss Maternal Grandmother    Heart disease Maternal  Grandmother    Cancer Maternal Grandfather        d.84 unspecified type of cancer   Heart disease Paternal Grandmother    Heart attack Paternal Grandmother    Lung cancer Paternal Grandfather    Testicular cancer Paternal Grandfather        d.85   Hearing loss Paternal Grandfather    Colon cancer Neg Hx    Colon polyps Neg Hx    Esophageal cancer Neg Hx    Rectal cancer Neg Hx    Stomach cancer Neg Hx     Social History   Tobacco Use   Smoking status: Never   Smokeless tobacco: Never  Vaping Use   Vaping status: Never Used  Substance Use Topics   Alcohol use: Never   Drug use: Never     No Known Allergies  Review of Systems  Respiratory:  Positive for cough.    NEGATIVE UNLESS OTHERWISE INDICATED IN HPI      Objective:     BP 106/74 (BP Location: Left Arm, Patient  Position: Sitting)   Pulse 91   Temp 98.8 F (37.1 C) (Temporal)   Ht 5\' 9"  (1.753 m)   Wt 141 lb 9.6 oz (64.2 kg)   SpO2 98%   BMI 20.91 kg/m   Wt Readings from Last 3 Encounters:  03/07/23 141 lb 9.6 oz (64.2 kg)  12/01/21 138 lb 3.2 oz (62.7 kg)  09/09/21 143 lb 12.8 oz (65.2 kg)    BP Readings from Last 3 Encounters:  03/07/23 106/74  12/01/21 124/68  09/09/21 95/65     Physical Exam Vitals and nursing note reviewed.  Constitutional:      General: She is not in acute distress.    Appearance: Normal appearance. She is not ill-appearing.  HENT:     Head: Normocephalic and atraumatic.     Right Ear: Tympanic membrane, ear canal and external ear normal.     Left Ear: Tympanic membrane, ear canal and external ear normal.     Nose: No congestion.     Mouth/Throat:     Mouth: Mucous membranes are moist.     Pharynx: No oropharyngeal exudate or posterior oropharyngeal erythema.  Eyes:     Extraocular Movements: Extraocular movements intact.     Conjunctiva/sclera: Conjunctivae normal.     Pupils: Pupils are equal, round, and reactive to light.  Cardiovascular:     Rate and Rhythm: Normal rate and regular rhythm.     Pulses: Normal pulses.     Heart sounds: Normal heart sounds. No murmur heard. Pulmonary:     Effort: Pulmonary effort is normal. No respiratory distress.     Breath sounds: Normal breath sounds. No wheezing.     Comments: Dry cough Musculoskeletal:     Cervical back: Normal range of motion.  Skin:    General: Skin is warm and dry.  Neurological:     General: No focal deficit present.     Mental Status: She is alert and oriented to person, place, and time.  Psychiatric:        Mood and Affect: Mood normal.        Behavior: Behavior normal.       Rasha Ibe M Constantinos Krempasky, PA-C

## 2023-04-11 ENCOUNTER — Encounter: Payer: Commercial Managed Care - PPO | Admitting: Family Medicine

## 2023-06-29 ENCOUNTER — Other Ambulatory Visit (HOSPITAL_COMMUNITY): Payer: Self-pay

## 2023-06-29 DIAGNOSIS — D2262 Melanocytic nevi of left upper limb, including shoulder: Secondary | ICD-10-CM | POA: Diagnosis not present

## 2023-06-29 DIAGNOSIS — L84 Corns and callosities: Secondary | ICD-10-CM | POA: Diagnosis not present

## 2023-06-29 DIAGNOSIS — L821 Other seborrheic keratosis: Secondary | ICD-10-CM | POA: Diagnosis not present

## 2023-06-29 DIAGNOSIS — D1801 Hemangioma of skin and subcutaneous tissue: Secondary | ICD-10-CM | POA: Diagnosis not present

## 2023-06-29 DIAGNOSIS — D225 Melanocytic nevi of trunk: Secondary | ICD-10-CM | POA: Diagnosis not present

## 2023-06-29 MED ORDER — TRETINOIN 0.025 % EX CREA
1.0000 | TOPICAL_CREAM | Freq: Every evening | CUTANEOUS | 4 refills | Status: AC
  Filled 2023-06-29: qty 20, 30d supply, fill #0
  Filled 2023-11-03: qty 20, 30d supply, fill #1

## 2023-08-25 ENCOUNTER — Encounter: Admitting: Family Medicine

## 2023-08-25 ENCOUNTER — Ambulatory Visit (INDEPENDENT_AMBULATORY_CARE_PROVIDER_SITE_OTHER): Admitting: Family Medicine

## 2023-08-25 ENCOUNTER — Encounter: Payer: Self-pay | Admitting: Family Medicine

## 2023-08-25 VITALS — BP 122/82 | HR 68 | Temp 97.9°F | Ht 69.0 in | Wt 142.8 lb

## 2023-08-25 DIAGNOSIS — Z0001 Encounter for general adult medical examination with abnormal findings: Secondary | ICD-10-CM | POA: Diagnosis not present

## 2023-08-25 DIAGNOSIS — R739 Hyperglycemia, unspecified: Secondary | ICD-10-CM | POA: Diagnosis not present

## 2023-08-25 DIAGNOSIS — E785 Hyperlipidemia, unspecified: Secondary | ICD-10-CM | POA: Diagnosis not present

## 2023-08-25 LAB — CBC
HCT: 40.5 % (ref 36.0–46.0)
Hemoglobin: 13.5 g/dL (ref 12.0–15.0)
MCHC: 33.2 g/dL (ref 30.0–36.0)
MCV: 82.6 fl (ref 78.0–100.0)
Platelets: 269 K/uL (ref 150.0–400.0)
RBC: 4.9 Mil/uL (ref 3.87–5.11)
RDW: 14.7 % (ref 11.5–15.5)
WBC: 5.1 K/uL (ref 4.0–10.5)

## 2023-08-25 LAB — LIPID PANEL
Cholesterol: 194 mg/dL (ref 0–200)
HDL: 66.7 mg/dL (ref >39.00)
LDL Cholesterol: 117 mg/dL — ABNORMAL HIGH (ref 0–99)
NonHDL: 127.75
Total CHOL/HDL Ratio: 3
Triglycerides: 54 mg/dL (ref 0.0–149.0)
VLDL: 10.8 mg/dL (ref 0.0–40.0)

## 2023-08-25 LAB — HEMOGLOBIN A1C: Hgb A1c MFr Bld: 5.9 % (ref 4.6–6.5)

## 2023-08-25 LAB — COMPREHENSIVE METABOLIC PANEL WITH GFR
ALT: 15 U/L (ref 0–35)
AST: 20 U/L (ref 0–37)
Albumin: 4.5 g/dL (ref 3.5–5.2)
Alkaline Phosphatase: 50 U/L (ref 39–117)
BUN: 12 mg/dL (ref 6–23)
CO2: 28 meq/L (ref 19–32)
Calcium: 9.3 mg/dL (ref 8.4–10.5)
Chloride: 106 meq/L (ref 96–112)
Creatinine, Ser: 0.75 mg/dL (ref 0.40–1.20)
GFR: 89.82 mL/min (ref >60.00)
Glucose, Bld: 80 mg/dL (ref 70–99)
Potassium: 4 meq/L (ref 3.5–5.1)
Sodium: 142 meq/L (ref 135–145)
Total Bilirubin: 0.7 mg/dL (ref 0.2–1.2)
Total Protein: 7 g/dL (ref 6.0–8.3)

## 2023-08-25 LAB — TSH: TSH: 4.3 u[IU]/mL (ref 0.35–5.50)

## 2023-08-25 NOTE — Assessment & Plan Note (Signed)
 Check lipids. Dicussed lifestyle modifcations.

## 2023-08-25 NOTE — Patient Instructions (Signed)
 It was very nice to see you today!  We will check blood work today.  Please keep up the great with your diet and exercise.  I will see back in 1 year for your next physical.  Come back sooner if needed.  Return in about 1 year (around 08/24/2024).   Take care, Dr Kennyth  PLEASE NOTE:  If you had any lab tests, please let us  know if you have not heard back within a few days. You may see your results on mychart before we have a chance to review them but we will give you a call once they are reviewed by us .   If we ordered any referrals today, please let us  know if you have not heard from their office within the next week.   If you had any urgent prescriptions sent in today, please check with the pharmacy within an hour of our visit to make sure the prescription was transmitted appropriately.   Please try these tips to maintain a healthy lifestyle:  Eat at least 3 REAL meals and 1-2 snacks per day.  Aim for no more than 5 hours between eating.  If you eat breakfast, please do so within one hour of getting up.   Each meal should contain half fruits/vegetables, one quarter protein, and one quarter carbs (no bigger than a computer mouse)  Cut down on sweet beverages. This includes juice, soda, and sweet tea.   Drink at least 1 glass of water with each meal and aim for at least 8 glasses per day  Exercise at least 150 minutes every week.    Preventive Care 41-67 Years Old, Female Preventive care refers to lifestyle choices and visits with your health care provider that can promote health and wellness. Preventive care visits are also called wellness exams. What can I expect for my preventive care visit? Counseling Your health care provider may ask you questions about your: Medical history, including: Past medical problems. Family medical history. Pregnancy history. Current health, including: Menstrual cycle. Method of birth control. Emotional well-being. Home life and  relationship well-being. Sexual activity and sexual health. Lifestyle, including: Alcohol, nicotine or tobacco, and drug use. Access to firearms. Diet, exercise, and sleep habits. Work and work Astronomer. Sunscreen use. Safety issues such as seatbelt and bike helmet use. Physical exam Your health care provider will check your: Height and weight. These may be used to calculate your BMI (body mass index). BMI is a measurement that tells if you are at a healthy weight. Waist circumference. This measures the distance around your waistline. This measurement also tells if you are at a healthy weight and may help predict your risk of certain diseases, such as type 2 diabetes and high blood pressure. Heart rate and blood pressure. Body temperature. Skin for abnormal spots. What immunizations do I need?  Vaccines are usually given at various ages, according to a schedule. Your health care provider will recommend vaccines for you based on your age, medical history, and lifestyle or other factors, such as travel or where you work. What tests do I need? Screening Your health care provider may recommend screening tests for certain conditions. This may include: Lipid and cholesterol levels. Diabetes screening. This is done by checking your blood sugar (glucose) after you have not eaten for a while (fasting). Pelvic exam and Pap test. Hepatitis B test. Hepatitis C test. HIV (human immunodeficiency virus) test. STI (sexually transmitted infection) testing, if you are at risk. Lung cancer screening. Colorectal cancer screening.  Mammogram. Talk with your health care provider about when you should start having regular mammograms. This may depend on whether you have a family history of breast cancer. BRCA-related cancer screening. This may be done if you have a family history of breast, ovarian, tubal, or peritoneal cancers. Bone density scan. This is done to screen for osteoporosis. Talk with your  health care provider about your test results, treatment options, and if necessary, the need for more tests. Follow these instructions at home: Eating and drinking  Eat a diet that includes fresh fruits and vegetables, whole grains, lean protein, and low-fat dairy products. Take vitamin and mineral supplements as recommended by your health care provider. Do not drink alcohol if: Your health care provider tells you not to drink. You are pregnant, may be pregnant, or are planning to become pregnant. If you drink alcohol: Limit how much you have to 0-1 drink a day. Know how much alcohol is in your drink. In the [place] one drink equals one 12 oz bottle of beer (355 mL), one 5 oz glass of wine (148 mL), or one 1 oz glass of hard liquor (44 mL). Lifestyle Brush your teeth every morning and night with fluoride toothpaste. Floss one time each day. Exercise for at least 30 minutes 5 or more days each week. Do not use any products that contain nicotine or tobacco. These products include cigarettes, chewing tobacco, and vaping devices, such as e-cigarettes. If you need help quitting, ask your health care provider. Do not use drugs. If you are sexually active, practice safe sex. Use a condom or other form of protection to prevent STIs. If you do not wish to become pregnant, use a form of birth control. If you plan to become pregnant, see your health care provider for a prepregnancy visit. Take aspirin only as told by your health care provider. Make sure that you understand how much to take and what form to take. Work with your health care provider to find out whether it is safe and beneficial for you to take aspirin daily. Find healthy ways to manage stress, such as: Meditation, yoga, or listening to music. Journaling. Talking to a trusted person. Spending time with friends and family. Minimize exposure to UV radiation to reduce your risk of skin cancer. Safety Always wear your seat belt while driving  or riding in a vehicle. Do not drive: If you have been drinking alcohol. Do not ride with someone who has been drinking. When you are tired or distracted. While texting. If you have been using any mind-altering substances or drugs. Wear a helmet and other protective equipment during sports activities. If you have firearms in your house, make sure you follow all gun safety procedures. Seek help if you have been physically or sexually abused. What's next? Visit your health care provider once a year for an annual wellness visit. Ask your health care provider how often you should have your eyes and teeth checked. Stay up to date on all vaccines. This information is not intended to replace advice given to you by your health care provider. Make sure you discuss any questions you have with your health care provider. Document Revised: 07/22/2020 Document Reviewed: 07/22/2020 Elsevier Patient Education  2024 [employer].

## 2023-08-25 NOTE — Progress Notes (Signed)
 Chief Complaint:  Tracey Leblanc is a 53 y.o. female who presents today for her annual comprehensive physical exam.    Assessment/Plan:  Chronic Problems Addressed Today: Dyslipidemia Check lipids. Dicussed lifestyle modifcations.   Preventative Healthcare: Check labs. Up to date on vaccines follows with [facility].   Patient Counseling(The following topics were reviewed and/or handout was given):  -Nutrition: Stressed importance of moderation in sodium/caffeine intake, saturated fat and cholesterol, caloric balance, sufficient intake of fresh fruits, vegetables, and fiber.  -Stressed the importance of regular exercise.   -Substance Abuse: Discussed cessation/primary prevention of tobacco, alcohol, or other drug use; driving or other dangerous activities under the influence; availability of treatment for abuse.   -Injury prevention: Discussed safety belts, safety helmets, smoke detector, smoking near bedding or upholstery.   -Sexuality: Discussed sexually transmitted diseases, partner selection, use of condoms, avoidance of unintended pregnancy and contraceptive alternatives.   -Dental health: Discussed importance of regular tooth brushing, flossing, and dental visits.  -Health maintenance and immunizations reviewed. Please refer to Health maintenance section.  Return to care in 1 year for next preventative visit.     Subjective:  HPI:  She has no acute complaints today. See assessment / plan for status of chronic conditions.   Lifestyle Diet: Trying to get more fruits and vegetables. Exercise: Does a lot of walking in [place].      08/25/2023   10:08 AM  Depression screen PHQ 2/9  Decreased Interest 0  Down, Depressed, Hopeless 0  PHQ - 2 Score 0    Health Maintenance Due  Topic Date Due   Hepatitis B Vaccines (1 of 3 - 19+ 3-dose series) Never done   Cervical Cancer Screening (HPV/Pap Cotest)  Never done     ROS: Per HPI, otherwise a  complete review of systems was negative.   PMH:  The following were reviewed and entered/updated in epic: Past Medical History:  Diagnosis Date   Allergy    seasonal   Family history of bladder cancer    Family history of liver cancer    History of chicken pox    UTI (urinary tract infection)    Patient Active Problem List   Diagnosis Date Noted   Dyslipidemia 07/04/2017   Genetic testing 11/01/2016   Family history of liver cancer    Family history of bladder cancer    Personal history of other malignant neoplasm of skin 06/26/2013   HEMATOCHEZIA 07/22/2008   Varicella 11/01/2006   Past Surgical History:  Procedure Laterality Date   BREAST CYST ASPIRATION     COLONOSCOPY  08/08/2008   DILATION AND CURETTAGE OF UTERUS  2002   3 miscarriages    Family History  Problem Relation Age of Onset   Liver cancer Mother 15       d.61   Bladder Cancer Father 14       diagnosed with second bladder cancer at 73   COPD Father    Hearing loss Father    Heart attack Father    Heart disease Father    Kidney disease Father    Alcohol abuse Brother    Depression Brother    Cancer Maternal Uncle        d.70s unspecified type of cancer   Hearing loss Maternal Grandmother    Heart disease Maternal Grandmother    Cancer Maternal Grandfather        d.84 unspecified type of cancer   Heart disease Paternal Grandmother  Heart attack Paternal Grandmother    Lung cancer Paternal Grandfather    Testicular cancer Paternal Grandfather        d.85   Hearing loss Paternal Grandfather    Colon cancer Neg Hx    Colon polyps Neg Hx    Esophageal cancer Neg Hx    Rectal cancer Neg Hx    Stomach cancer Neg Hx     Medications- reviewed and updated Current Outpatient Medications  Medication Sig Dispense Refill   tretinoin  (RETIN-A ) 0.025 % cream Apply a small amount to affected area topically every evening. 20 g 4   Vitamin D, Ergocalciferol, (DRISDOL) 1.25 MG (50000 UNIT) CAPS capsule  Take 50,000 Units by mouth every 7 (seven) days.     No current facility-administered medications for this visit.    Allergies-reviewed and updated No Known Allergies  Social History   Socioeconomic History   Marital status: Divorced    Spouse name: Not on file   Number of children: Not on file   Years of education: Not on file   Highest education level: Not on file  Occupational History   Not on file  Tobacco Use   Smoking status: Never   Smokeless tobacco: Never  Vaping Use   Vaping status: Never Used  Substance and Sexual Activity   Alcohol use: Never   Drug use: Never   Sexual activity: Not on file  Other Topics Concern   Not on file  Social History Narrative   Not on file   Social Drivers of Health   Financial Resource Strain: Not on file  Food Insecurity: Not on file  Transportation Needs: Not on file  Physical Activity: Not on file  Stress: Not on file  Social Connections: Not on file        Objective:  Physical Exam: BP 122/82   Pulse 68   Temp 97.9 F (36.6 C)   Ht 5' 9 (1.753 m)   Wt 142 lb 12.8 oz (64.8 kg)   SpO2 98%   BMI 21.09 kg/m   Body mass index is 21.09 kg/m. Wt Readings from Last 3 Encounters:  08/25/23 142 lb 12.8 oz (64.8 kg)  03/07/23 141 lb 9.6 oz (64.2 kg)  12/01/21 138 lb 3.2 oz (62.7 kg)   Gen: NAD, resting comfortably HEENT: TMs normal bilaterally. OP clear. No thyromegaly noted.  CV: RRR with no murmurs appreciated Pulm: NWOB, CTAB with no crackles, wheezes, or rhonchi GI: Normal bowel sounds present. Soft, Nontender, Nondistended. MSK: no edema, cyanosis, or clubbing noted Skin: warm, dry Neuro: CN2-12 grossly intact. Strength 5/5 in upper and lower extremities. Reflexes symmetric and intact bilaterally.  Psych: Normal affect and thought content     Carrisa Keller M. Kennyth, MD 08/25/2023 10:46 AM

## 2023-08-28 ENCOUNTER — Ambulatory Visit: Payer: Self-pay | Admitting: Family Medicine

## 2023-08-28 NOTE — Progress Notes (Signed)
 Your cholesterol is borderline but better than last year.  Her A1c is mildly elevated but overall stable.  Do not start meds for either of these however she should continue to work on diet and exercise and we can recheck in a year.

## 2023-11-17 DIAGNOSIS — Z6821 Body mass index (BMI) 21.0-21.9, adult: Secondary | ICD-10-CM | POA: Diagnosis not present

## 2023-11-17 DIAGNOSIS — Z01419 Encounter for gynecological examination (general) (routine) without abnormal findings: Secondary | ICD-10-CM | POA: Diagnosis not present

## 2023-11-17 DIAGNOSIS — Z1231 Encounter for screening mammogram for malignant neoplasm of breast: Secondary | ICD-10-CM | POA: Diagnosis not present

## 2023-12-07 ENCOUNTER — Ambulatory Visit: Payer: Self-pay

## 2023-12-07 NOTE — Telephone Encounter (Signed)
 FYI Only or Action Required?: FYI only for provider: appointment scheduled on 10/31.  Patient was last seen in primary care on 08/25/2023 by Kennyth Worth HERO, MD.  Called Nurse Triage reporting Urinary Frequency.  Symptoms began x 2 days ago.  Interventions attempted: Rest, hydration, or home remedies.  Symptoms are: unchanged.  Triage Disposition: See Physician Within 24 Hours  Patient/caregiver understands and will follow disposition?: Yes        Copied from CRM #[id number]. Topic: Clinical - Red Word Triage >> Dec 07, 2023 11:55 AM Viola FALCON wrote: Red Word that prompted transfer to Nurse Triage: Patient having burning/frequent urination, believes its UTI and requested medication Reason for Disposition  Urinating more frequently than usual (i.e., frequency) OR new-onset of the feeling of an urgent need to urinate (i.e., urgency)  Answer Assessment - Initial Assessment Questions 1. SYMPTOM: What's the main symptom you're concerned about? (e.g., frequency, incontinence)      burning/frequent urination  2. ONSET: When did the  symptoms  start?     X 2 days   3. PAIN: Is there any pain? If Yes, ask: How bad is it? (Scale: 1-10; mild, moderate, severe)     No   4. CAUSE: What do you think is causing the symptoms?     Possible UTI  5. OTHER SYMPTOMS: Do you have any other symptoms? (e.g., blood in urine, fever, flank pain, pain with urination)  No.   Patient reports increasing water intake. Appt. Scheduled.  Protocols used: Urinary Symptoms-A-AH

## 2023-12-07 NOTE — Telephone Encounter (Signed)
 Pt has an appt on 12-08-23 regarding Urinary problem smk

## 2023-12-08 ENCOUNTER — Other Ambulatory Visit (HOSPITAL_COMMUNITY): Payer: Self-pay

## 2023-12-08 ENCOUNTER — Ambulatory Visit: Admitting: Family Medicine

## 2023-12-08 VITALS — BP 102/64 | HR 70 | Temp 98.2°F | Ht 69.0 in | Wt 141.8 lb

## 2023-12-08 DIAGNOSIS — R350 Frequency of micturition: Secondary | ICD-10-CM

## 2023-12-08 DIAGNOSIS — H5203 Hypermetropia, bilateral: Secondary | ICD-10-CM | POA: Diagnosis not present

## 2023-12-08 LAB — URINALYSIS, ROUTINE W REFLEX MICROSCOPIC
Bilirubin Urine: NEGATIVE
Hgb urine dipstick: NEGATIVE
Ketones, ur: NEGATIVE
Leukocytes,Ua: NEGATIVE
Nitrite: NEGATIVE
Specific Gravity, Urine: >=1.03 — AB (ref 1.000–1.030)
Total Protein, Urine: NEGATIVE
Urine Glucose: NEGATIVE
Urobilinogen, UA: 0.2 (ref 0.0–1.0)
pH: 5.5 (ref 5.0–8.0)

## 2023-12-08 MED ORDER — NITROFURANTOIN MONOHYD MACRO 100 MG PO CAPS
100.0000 mg | ORAL_CAPSULE | Freq: Two times a day (BID) | ORAL | 0 refills | Status: AC
  Filled 2023-12-08: qty 14, 7d supply, fill #0

## 2023-12-08 NOTE — Progress Notes (Signed)
   SHALESE STRAHAN is a 53 y.o. female who presents today for an office visit.  Assessment/Plan:  Urinary Frequency  History consistent with UTI.  Send out urinalysis is pending. No red flags or signs of systemic illness.  Will empirically start Macrobid  while we wait culture results.  Encouraged hydration.  Discussed reasons to return to care.  Follow-up as needed.      Subjective:  HPI:  See assessment / plan for status of chronic conditions.   Discussed the use of AI scribe software for clinical note transcription with the patient, who gave verbal consent to proceed.  History of Present Illness LAKESA COSTE is a 53 year old female who presents with urinary frequency and burning sensation.  She has experienced increased urinary frequency over the past few days, with a noticeable increase in symptoms yesterday. There is a mild burning sensation during urination, but no pain or hematuria. These symptoms often occur when she is under stress, and she is currently experiencing stress due to having a new puppy at home, which has affected her sleep.  She has a history of urinary tract infections and recognizes the symptoms, although the frequency is not as severe as in past episodes. She is concerned about the symptoms worsening over the weekend when access to care might be limited.  She has been drinking water to help alleviate the symptoms. In the past, she has taken Macrobid  for UTIs, which once caused nausea, but she has tolerated it well on other occasions.         Objective:  Physical Exam: BP 102/64   Pulse 70   Temp 98.2 F (36.8 C) (Temporal)   Ht 5' 9 (1.753 m)   Wt 141 lb 12.8 oz (64.3 kg)   SpO2 98%   BMI 20.94 kg/m   Gen: No acute distress, resting comfortably Neuro: Grossly normal, moves all extremities Psych: Normal affect and thought content      Librada Castronovo M. Kennyth, MD 12/08/2023 7:42 AM

## 2023-12-08 NOTE — Patient Instructions (Addendum)
 It was very nice to see you today!  VISIT SUMMARY: You came in today because you have been experiencing increased urinary frequency and a mild burning sensation during urination. These symptoms have been more noticeable recently, and you are concerned they might worsen over the weekend.  YOUR PLAN: URINARY FREQUENCY AND DYSURIA (POSSIBLE URINARY TRACT INFECTION): Your symptoms suggest a possible urinary tract infection (UTI). -We have sent a urine sample for urinalysis and culture to confirm the diagnosis. -You will start taking Macrobid  (nitrofurantoin ) to treat the possible UTI. Please take it with food to minimize potential side effects. -Your prescription has been sent to [facility] on [address]. -Please follow up on Monday to review the urine culture results.  Return if symptoms worsen or fail to improve.   Take care, Dr Kennyth  PLEASE NOTE:  If you had any lab tests, please let us  know if you have not heard back within a few days. You may see your results on mychart before we have a chance to review them but we will give you a call once they are reviewed by us .   If we ordered any referrals today, please let us  know if you have not heard from their office within the next week.   If you had any urgent prescriptions sent in today, please check with the pharmacy within an hour of our visit to make sure the prescription was transmitted appropriately.   Please try these tips to maintain a healthy lifestyle:  Eat at least 3 REAL meals and 1-2 snacks per day.  Aim for no more than 5 hours between eating.  If you eat breakfast, please do so within one hour of getting up.   Each meal should contain half fruits/vegetables, one quarter protein, and one quarter carbs (no bigger than a computer mouse)  Cut down on sweet beverages. This includes juice, soda, and sweet tea.   Drink at least 1 glass of water with each meal and aim for at least 8 glasses per day  Exercise  at least 150 minutes every week.

## 2023-12-09 LAB — URINE CULTURE
MICRO NUMBER:: 17175600
SPECIMEN QUALITY:: ADEQUATE

## 2023-12-12 ENCOUNTER — Ambulatory Visit: Payer: Self-pay | Admitting: Family Medicine

## 2023-12-12 NOTE — Progress Notes (Signed)
 Urine culture was negative for UTI.  She should let us  know if her symptoms are not improving.

## 2023-12-14 ENCOUNTER — Telehealth: Payer: Self-pay | Admitting: *Deleted

## 2023-12-14 NOTE — Telephone Encounter (Signed)
 Copied from CRM [id number]. Topic: Clinical - Lab/Test Results >> Dec 13, 2023  9:48 AM China J wrote: Reason for CRM: The patient is returning a call from the clinic. She wanted to let Dr. Kennyth know that her symptoms are much better.   Noted  Kenard Morawski,RMA

## 2024-02-06 ENCOUNTER — Ambulatory Visit: Payer: Self-pay

## 2024-02-06 NOTE — Telephone Encounter (Signed)
 FYI Only or Action Required?: FYI only for provider: advised urgent care, patient would prefer to continue home care and monitor symtpoms.  Patient was last seen in primary care on 12/08/2023 by Kennyth Worth HERO, MD.  Called Nurse Triage reporting Cough, Sinusitis, and Fatigue.  Symptoms began a week ago.  Interventions attempted: OTC medications: Flonase, Delsym, Dayquil.  Symptoms are: gradually worsening.  Triage Disposition: See HCP Within 4 Hours (Or PCP Triage)  Patient/caregiver understands and will follow disposition?: No               Copied from CRM #[id number]. Topic: Clinical - Red Word Triage >> Feb 06, 2024  8:17 AM Willma R wrote: [facility] that prompted transfer to Nurse Triage: Patient states for the last week started with a sore throat and after christmas got worse with congestion, continuous cough, headache and sinus pressure. Reason for Disposition  Wheezing is present  Answer Assessment - Initial Assessment Questions Patient states she just started taking Delsym, Dayquil and Flonase yesterday so since the office doesn't have any appt she would prefer to hold off on going to urgent care and continue home care. Advised patient for wheezing and sinus pressure with green mucous she may need prescriptions and evaluation. Patient verbalizes understanding and states she would like to monitor at home due to she is afraid she will pick up a worse infection at urgent care.   1. ONSET: When did the cough begin?      5 days.  2. SEVERITY: How bad is the cough today?      Nagging, irritating and worsening. Worse when lying down and at night. She states she has spells of coughing that last up to 1.5 hours.  3. SPUTUM: Describe the color of your sputum (e.g., none, dry cough; clear, white, yellow, green)     Dry.  4. HEMOPTYSIS: Are you coughing up any blood? If Yes, ask: How much? (e.g., flecks, streaks, tablespoons, etc.)     No.  5. DIFFICULTY  BREATHING: Are you having difficulty breathing? If Yes, ask: How bad is it? (e.g., mild, moderate, severe)      No SOB but states some wheezing is present.  6. FEVER: Do you have a fever? If Yes, ask: What is your temperature, how was it measured, and when did it start?     Lowgrade 99.1.  7. CARDIAC HISTORY: Do you have any history of heart disease? (e.g., heart attack, congestive heart failure)      No.  8. LUNG HISTORY: Do you have any history of lung disease?  (e.g., pulmonary embolus, asthma, emphysema)     No.  9. PE RISK FACTORS: Do you have a history of blood clots? (or: recent major surgery, recent prolonged travel, bedridden)     No.  10. OTHER SYMPTOMS: Do you have any other symptoms? (e.g., runny nose, wheezing, chest pain)       Sinus pressure  (forehead), clear to green nasal discharge and congestion, fatigue, started a week ago with a sore throat that has resolved.  11. PREGNANCY: Is there any chance you are pregnant? When was your last menstrual period?       N/A.  12. TRAVEL: Have you traveled out of the country in the last month? 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  Protocols used: Cough - Acute Non-Productive-A-AH

## 2024-02-06 NOTE — Telephone Encounter (Signed)
 Noted

## 2024-02-07 ENCOUNTER — Ambulatory Visit
Admission: EM | Admit: 2024-02-07 | Discharge: 2024-02-07 | Disposition: A | Discharge status: Home / Self Care | Source: Home / Self Care

## 2024-02-07 ENCOUNTER — Other Ambulatory Visit (HOSPITAL_COMMUNITY): Payer: Self-pay

## 2024-02-07 ENCOUNTER — Other Ambulatory Visit: Payer: Self-pay

## 2024-02-07 DIAGNOSIS — J209 Acute bronchitis, unspecified: Secondary | ICD-10-CM

## 2024-02-07 MED ORDER — PREDNISONE 20 MG PO TABS
40.0000 mg | ORAL_TABLET | Freq: Every day | ORAL | 0 refills | Status: AC
  Filled 2024-02-07: qty 10, 5d supply, fill #0

## 2024-02-07 MED ORDER — AZITHROMYCIN 250 MG PO TABS
ORAL_TABLET | ORAL | 0 refills | Status: AC
  Filled 2024-02-07: qty 6, 5d supply, fill #0

## 2024-02-07 NOTE — ED Triage Notes (Addendum)
 Pt presents with c/o dry cough, sinus pressure, congestion, and fatigue x 1 week. Mucus is now green. Has been taking OTC Delsym and Dayquil for symptoms. Seems to be helping. No fevers. Works in the [employer]. No pain. Did have a sore throat at the onset of symptoms however this has now resolved.

## 2024-02-07 NOTE — Discharge Instructions (Addendum)
 You were seen today for concerns of coughing, sinus pressure and fatigue.  At this time I am suspicious that you may have a case of acute bronchitis which is causing your persistent coughing.  You had some coarse breath sounds in your lungs so I am sending in an antibiotic called azithromycin , also known as a Z-Pak as well as a short steroid burst to help with your breathing and coughing.  Please finish the entire course of the medications as directed.  If you start to have more severe symptoms such as significant shortness of breath, fever that is not responding to Tylenol and ibuprofen, swelling of the face, neck pain or stiffness, confusion, loss of consciousness please go to the emergency room.  You can continue to take over-the-counter medications such as DayQuil/NyQuil, Delsym, Robitussin, Mucinex  as needed.  To help with your nasal concerns I also recommend using a nasal steroid such as Flonase or azelastine  as well as sinus flushes and rinses.

## 2024-02-07 NOTE — ED Provider Notes (Signed)
 [facility]    CSN: [id number] Arrival date & time: 02/07/24  0805      History   Chief Complaint Chief Complaint  Patient presents with   Sinus Pressure    Fatigue   Cough    HPI Tracey Leblanc is a 53 y.o. female.   HPI  Pt is here today with concerns of headache, sinus pressure, nasal congestion, coughing that is worse at night She states this started on 12/24 with sore throat and mild fatigue and progressed to include other symptoms. 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  She feels like her symptoms are a bit better today than yesterday  Interventions: Delsym, Dayquil   Past Medical History:  Diagnosis Date   Allergy    seasonal   Family history of bladder cancer    Family history of liver cancer    History of chicken pox    UTI (urinary tract infection)     Patient Active Problem List   Diagnosis Date Noted   Dyslipidemia 07/04/2017   Genetic testing 11/01/2016   Family history of liver cancer    Family history of bladder cancer    Personal history of other malignant neoplasm of skin 06/26/2013   HEMATOCHEZIA 07/22/2008   Varicella 11/01/2006    Past Surgical History:  Procedure Laterality Date   BREAST CYST ASPIRATION     COLONOSCOPY  08/08/2008   DILATION AND CURETTAGE OF UTERUS  2002   3 miscarriages    OB History   No obstetric history on file.      Home Medications    Prior to Admission medications  Medication Sig Start Date End Date Taking? Authorizing Provider  azithromycin  (ZITHROMAX ) 250 MG tablet Take 2 tablets (500 mg total) by mouth daily for 1 day, THEN 1 tablet (250 mg total) daily for 4 days. 02/07/24  02/12/24 Yes Barth Trella E, PA-C  predniSONE (DELTASONE) 20 MG tablet Take 2 tablets (40 mg total) by mouth daily for 5 days. 02/07/24 02/12/24 Yes Kynzley Dowson E, PA-C  nitrofurantoin , macrocrystal-monohydrate, (MACROBID ) 100 MG capsule Take 1 capsule (100 mg total) by mouth 2 (two) times daily. 12/08/23   Kennyth Worth HERO, MD  tretinoin  (RETIN-A ) 0.025 % cream Apply a small amount to affected area topically every evening. 06/29/23     Vitamin D, Ergocalciferol, (DRISDOL) 1.25 MG (50000 UNIT) CAPS capsule Take 50,000 Units by mouth every 7 (seven) days.    [provider]    Family History Family History  Problem Relation Age of Onset   Liver cancer Mother 63       d.61   Bladder Cancer Father 23       diagnosed with second bladder cancer at 26   COPD Father    Hearing loss Father    Heart attack Father    Heart disease Father    Kidney disease Father    Alcohol abuse Brother    Depression Brother    Hearing loss Maternal Grandmother    Heart disease Maternal Grandmother    Cancer Maternal Grandfather        d.84 unspecified type of cancer   Heart  disease Paternal Grandmother    Heart attack Paternal Grandmother    Lung cancer Paternal Grandfather    Testicular cancer Paternal Grandfather        d.85   Hearing loss Paternal Grandfather    Cancer Maternal Uncle        d.70s unspecified type of cancer   Colon cancer Neg Hx    Colon polyps Neg Hx    Esophageal cancer Neg Hx    Rectal cancer Neg Hx    Stomach cancer Neg Hx     Social History Social History[1]   Allergies   Patient has no known allergies.   Review of Systems Review of Systems  Constitutional:  Positive for chills and fatigue. Negative for fever.  HENT:  Positive for congestion, rhinorrhea, sinus pressure and sore throat.   Respiratory:  Positive for cough. Negative for shortness of breath and wheezing.   Gastrointestinal:  Negative for diarrhea, nausea and vomiting.  Musculoskeletal:  Positive for  myalgias.     Physical Exam Triage Vital Signs ED Triage Vitals  Encounter Vitals Group     BP 02/07/24 0821 104/71     Girls Systolic BP Percentile --      Girls Diastolic BP Percentile --      Boys Systolic BP Percentile --      Boys Diastolic BP Percentile --      Pulse Rate 02/07/24 0821 100     Resp 02/07/24 0821 16     Temp 02/07/24 0821 98.3 F (36.8 C)     Temp Source 02/07/24 0821 Oral     SpO2 02/07/24 0821 95 %     Weight 02/07/24 0827 143 lb (64.9 kg)     Height 02/07/24 0827 5' 9 (1.753 m)     Head Circumference --      Peak Flow --      Pain Score 02/07/24 0827 0     Pain Loc --      Pain Education --      Exclude from Growth Chart --    No data found.  Updated Vital Signs BP 104/71 (BP Location: Right Arm)   Pulse 100   Temp 98.3 F (36.8 C) (Oral)   Resp 16   Ht 5' 9 (1.753 m)   Wt 143 lb (64.9 kg)   SpO2 95%   BMI 21.12 kg/m   Visual Acuity Right Eye Distance:   Left Eye Distance:   Bilateral Distance:    Right Eye Near:   Left Eye Near:    Bilateral Near:     Physical Exam Vitals reviewed.  Constitutional:      General: She is awake.     Appearance: Normal appearance. She is well-developed and well-groomed.  HENT:     Head: Normocephalic and atraumatic.     Right Ear: Hearing, tympanic membrane and ear canal normal.     Left Ear: Hearing, tympanic membrane and ear canal normal.     Nose:     Right Sinus: No maxillary sinus tenderness or frontal sinus tenderness.     Left Sinus: No maxillary sinus tenderness or frontal sinus tenderness.     Mouth/Throat:     Lips: Pink.     Mouth: Mucous membranes are moist.     Pharynx: Oropharynx is clear. Uvula midline. No pharyngeal swelling, oropharyngeal exudate, posterior oropharyngeal erythema, uvula swelling or postnasal drip.  Cardiovascular:     Rate and Rhythm: Normal rate and regular rhythm.  Pulses: Normal pulses.          Radial pulses are 2+ on the right side and 2+ on the  left side.     Heart sounds: Normal heart sounds. No murmur heard.    No friction rub. No gallop.  Pulmonary:     Effort: Pulmonary effort is normal. No accessory muscle usage, respiratory distress or retractions.     Breath sounds: No decreased air movement. Wheezing and rhonchi present. No decreased breath sounds or rales.     Comments: Coarse breath sounds globally- do not resolve with coughing Musculoskeletal:     Cervical back: Normal range of motion and neck supple.  Lymphadenopathy:     Head:     Right side of head: No submental, submandibular or preauricular adenopathy.     Left side of head: No submental, submandibular or preauricular adenopathy.     Cervical:     Right cervical: No superficial cervical adenopathy.    Left cervical: No superficial cervical adenopathy.     Upper Body:     Right upper body: No supraclavicular adenopathy.     Left upper body: No supraclavicular adenopathy.  Neurological:     Mental Status: She is alert.  Psychiatric:        Mood and Affect: Mood normal.        Behavior: Behavior normal. Behavior is cooperative.        Thought Content: Thought content normal.        Judgment: Judgment normal.      UC Treatments / Results  Labs (all labs ordered are listed, but only abnormal results are displayed) Labs Reviewed - No data to display  EKG   Radiology No results found.  Procedures Procedures (including critical care time)  Medications Ordered in UC Medications - No data to display  Initial Impression / Assessment and Plan / UC Course  I have reviewed the triage vital signs and the nursing notes.  Pertinent labs & imaging results that were available during my care of the patient were reviewed by me and considered in my medical decision making (see chart for details).      Final Clinical Impressions(s) / UC Diagnoses   Final diagnoses:  Acute bronchitis, unspecified organism   Patient presents today with concerns for  coughing, sinus pressure and fatigue that is been ongoing since 01/31/2024.  She reports some mild improvement with OTC home measures but states that she is concerned for persistent nasal congestion and mucus production.  Physical exam is notable for coarse breath sounds globally but do not resolve with coughing.  Vitals show heart rate of 100 and an oxygen saturation of 95%.  At this time I am suspicious for acute bronchitis so we will start azithromycin  as well as prednisone burst to assist with symptoms.  Reviewed that she can continue to take OTC medications as needed to help with symptomatic management.  ED and return precautions reviewed and provided in AVS.  Follow-up as needed.    Discharge Instructions      You were seen today for concerns of coughing, sinus pressure and fatigue.  At this time I am suspicious that you may have a case of acute bronchitis which is causing your persistent coughing.  You had some coarse breath sounds in your lungs so I am sending in an antibiotic called azithromycin , also known as a Z-Pak as well as a short steroid burst to help with your breathing and coughing.  Please finish the entire  course of the medications as directed.  If you start to have more severe symptoms such as significant shortness of breath, fever that is not responding to Tylenol and ibuprofen, swelling of the face, neck pain or stiffness, confusion, loss of consciousness please go to the emergency room.  You can continue to take over-the-counter medications such as DayQuil/NyQuil, Delsym, Robitussin, Mucinex  as needed.  To help with your nasal concerns I also recommend using a nasal steroid such as Flonase or azelastine  as well as sinus flushes and rinses.     ED Prescriptions     Medication Sig Dispense Auth. Provider   azithromycin  (ZITHROMAX ) 250 MG tablet Take 2 tablets (500 mg total) by mouth daily for 1 day, THEN 1 tablet (250 mg total) daily for 4 days. 6 tablet Halimah Bewick E, PA-C    predniSONE (DELTASONE) 20 MG tablet Take 2 tablets (40 mg total) by mouth daily for 5 days. 10 tablet Murrell Dome E, PA-C      PDMP not reviewed this encounter.     [1]  Social History Tobacco Use   Smoking status: Never   Smokeless tobacco: Never  Vaping Use   Vaping status: Never Used  Substance Use Topics   Alcohol use: Never   Drug use: Never     Talaysha Freeberg, Rocky BRAVO, PA-C 02/07/24 1028  "

## 2024-03-08 ENCOUNTER — Encounter: Payer: Self-pay | Admitting: Family Medicine

## 2024-03-08 ENCOUNTER — Telehealth (INDEPENDENT_AMBULATORY_CARE_PROVIDER_SITE_OTHER): Admitting: Student

## 2024-03-08 ENCOUNTER — Ambulatory Visit: Payer: Self-pay

## 2024-03-08 ENCOUNTER — Encounter: Payer: Self-pay | Admitting: Student

## 2024-03-08 DIAGNOSIS — Z91199 Patient's noncompliance with other medical treatment and regimen due to unspecified reason: Secondary | ICD-10-CM

## 2024-03-08 NOTE — Telephone Encounter (Signed)
 Please schedule a VV for F/U bronchitis

## 2024-03-08 NOTE — Telephone Encounter (Signed)
 Reviewed

## 2024-03-08 NOTE — Telephone Encounter (Signed)
" °  FYI Only or Action Required?: FYI only for provider: appointment scheduled on 2.2.26.  Patient was last seen in primary care on 12/08/2023 by Kennyth Worth HERO, MD.  Called Nurse Triage reporting Dizziness.  Symptoms began yesterday.  Interventions attempted: Nothing.  Symptoms are: gradually worsening.  Triage Disposition: See Physician Within 24 Hours  Patient/caregiver understands and will follow disposition?: Yes   Message from [place] F sent at 03/08/2024  4:04 PM EST  Reason for Triage: Patient having ear pain and nausea today, dizziness yesterday - requesting appt   Reason for Disposition  [1] MODERATE dizziness (e.g., interferes with normal activities) AND [2] has NOT been evaluated by doctor (or NP/PA) for this  (Exception: Dizziness caused by heat exposure, sudden standing, or poor fluid intake.)  Answer Assessment - Initial Assessment Questions 1. DESCRIPTION: Describe your dizziness.     Feeling of dizziness have to sit down after periods of time  2. LIGHTHEADED: Do you feel lightheaded? (e.g., somewhat faint, woozy, weak upon standing)     Yes  3. VERTIGO: Do you feel like either you or the room is spinning or tilting? (i.e., vertigo)     No  4. SEVERITY: How bad is it?  Do you feel like you are going to faint? Can you stand and walk?     Can still complete ADLs  5. ONSET:  When did the dizziness begin?     X yesterday  6. AGGRAVATING FACTORS: Does anything make it worse? (e.g., standing, change in head position)     Changing positions or moving head too quickly  7. HEART RATE: Can you tell me your heart rate? How many beats in 15 seconds?  (Note: Not all patients can do this.)        Denies  8. CAUSE: What do you think is causing the dizziness? (e.g., decreased fluids or food, diarrhea, emotional distress, heat exposure, new medicine, sudden standing, vomiting; unknown)      Recently got over Bronchitis  9. RECURRENT SYMPTOM: Have  you had dizziness before? If Yes, ask: When was the last time? What happened that time?      Denies  10. OTHER SYMPTOMS: Do you have any other symptoms? (e.g., fever, chest pain, vomiting, diarrhea, bleeding)        Ear pain both ears and nausea, cough  Pt reports ear pain both ears and dizziness Pt scheduled for a visit on  2.2.26 for further evaluation. Pt agrees with plan of care, will call back for any worsening symptoms  Protocols used: Dizziness - Lightheadedness-A-AH  "

## 2024-03-08 NOTE — Progress Notes (Signed)
 Error- Visit changed to in person visit

## 2024-03-11 ENCOUNTER — Ambulatory Visit: Admitting: Family

## 2024-08-26 ENCOUNTER — Encounter: Admitting: Family Medicine
